# Patient Record
Sex: Female | Born: 1970 | Race: White | Hispanic: No | Smoking: Former smoker
Health system: Southern US, Community
[De-identification: ages and names within clinical notes are randomized; demographics above are authoritative.]

## PROBLEM LIST (undated history)

## (undated) DIAGNOSIS — L709 Acne, unspecified: Secondary | ICD-10-CM

## (undated) DIAGNOSIS — D649 Anemia, unspecified: Secondary | ICD-10-CM

## (undated) DIAGNOSIS — B019 Varicella without complication: Secondary | ICD-10-CM

## (undated) DIAGNOSIS — D229 Melanocytic nevi, unspecified: Secondary | ICD-10-CM

## (undated) DIAGNOSIS — R87619 Unspecified abnormal cytological findings in specimens from cervix uteri: Secondary | ICD-10-CM

## (undated) DIAGNOSIS — Z8619 Personal history of other infectious and parasitic diseases: Secondary | ICD-10-CM

## (undated) DIAGNOSIS — IMO0002 Reserved for concepts with insufficient information to code with codable children: Secondary | ICD-10-CM

## (undated) HISTORY — PX: SKIN BIOPSY: SHX1

## (undated) HISTORY — PX: GYNECOLOGIC CRYOSURGERY: SHX857

## (undated) HISTORY — DX: Anemia, unspecified: D64.9

## (undated) HISTORY — PX: CERVICAL BIOPSY  W/ LOOP ELECTRODE EXCISION: SUR135

## (undated) HISTORY — DX: Unspecified abnormal cytological findings in specimens from cervix uteri: R87.619

## (undated) HISTORY — PX: OTHER SURGICAL HISTORY: SHX169

## (undated) HISTORY — PX: COSMETIC SURGERY: SHX468

## (undated) HISTORY — DX: Reserved for concepts with insufficient information to code with codable children: IMO0002

## (undated) HISTORY — PX: TONSILLECTOMY: SHX5217

## (undated) HISTORY — PX: BREAST SURGERY: SHX581

## (undated) HISTORY — DX: Melanocytic nevi, unspecified: D22.9

## (undated) HISTORY — DX: Personal history of other infectious and parasitic diseases: Z86.19

## (undated) HISTORY — DX: Acne, unspecified: L70.9

## (undated) HISTORY — DX: Varicella without complication: B01.9

---

## 2008-06-26 LAB — HM MAMMOGRAPHY

## 2010-09-25 LAB — HM PAP SMEAR

## 2011-08-08 ENCOUNTER — Encounter: Payer: Self-pay | Admitting: Family Medicine

## 2011-08-08 ENCOUNTER — Ambulatory Visit (INDEPENDENT_AMBULATORY_CARE_PROVIDER_SITE_OTHER): Payer: BC Managed Care – PPO | Admitting: Family Medicine

## 2011-08-08 DIAGNOSIS — L709 Acne, unspecified: Secondary | ICD-10-CM

## 2011-08-08 DIAGNOSIS — Z23 Encounter for immunization: Secondary | ICD-10-CM

## 2011-08-08 DIAGNOSIS — Z309 Encounter for contraceptive management, unspecified: Secondary | ICD-10-CM

## 2011-08-08 DIAGNOSIS — Z0000 Encounter for general adult medical examination without abnormal findings: Secondary | ICD-10-CM

## 2011-08-08 DIAGNOSIS — L708 Other acne: Secondary | ICD-10-CM

## 2011-08-08 DIAGNOSIS — D229 Melanocytic nevi, unspecified: Secondary | ICD-10-CM

## 2011-08-08 DIAGNOSIS — R87619 Unspecified abnormal cytological findings in specimens from cervix uteri: Secondary | ICD-10-CM

## 2011-08-08 DIAGNOSIS — IMO0002 Reserved for concepts with insufficient information to code with codable children: Secondary | ICD-10-CM

## 2011-08-08 DIAGNOSIS — D239 Other benign neoplasm of skin, unspecified: Secondary | ICD-10-CM

## 2011-08-08 HISTORY — DX: Unspecified abnormal cytological findings in specimens from cervix uteri: R87.619

## 2011-08-08 HISTORY — DX: Melanocytic nevi, unspecified: D22.9

## 2011-08-08 HISTORY — DX: Acne, unspecified: L70.9

## 2011-08-08 LAB — RENAL FUNCTION PANEL
Chloride: 108 mEq/L (ref 96–112)
GFR: 101.56 mL/min (ref >60.00)
Glucose, Bld: 84 mg/dL (ref 70–99)
Phosphorus: 2.9 mg/dL (ref 2.3–4.6)
Potassium: 4.2 mEq/L (ref 3.5–5.1)
Sodium: 139 mEq/L (ref 135–145)

## 2011-08-08 LAB — CBC
MCHC: 33.7 g/dL (ref 30.0–36.0)
MCV: 90.8 fl (ref 78.0–100.0)

## 2011-08-08 LAB — LIPID PANEL
HDL: 79.8 mg/dL (ref >39.00)
LDL Cholesterol: 85 mg/dL (ref 0–99)
Total CHOL/HDL Ratio: 2
Triglycerides: 50 mg/dL (ref 0.0–149.0)
VLDL: 10 mg/dL (ref 0.0–40.0)

## 2011-08-08 LAB — TSH: TSH: 1.27 u[IU]/mL (ref 0.35–5.50)

## 2011-08-08 LAB — HEPATIC FUNCTION PANEL
Bilirubin, Direct: 0.1 mg/dL (ref 0.0–0.3)
Total Bilirubin: 0.7 mg/dL (ref 0.3–1.2)

## 2011-08-08 MED ORDER — PARAGARD INTRAUTERINE COPPER IU IUD: INTRAUTERINE_SYSTEM | INTRAUTERINE | Status: DC

## 2011-08-08 NOTE — Progress Notes (Signed)
Patient ID: Susan Mcdaniel, female   DOB: Apr 26, 1971, 41 y.o.   MRN: [id number] QUANIYA DAMAS [id number] [birth year] 08/08/2011      Progress Note New Patient  Subjective  Chief Complaint  Chief Complaint  Patient presents with  . Establish Care    new patient    HPI  This is a 41 year old Caucasian female who is in today for new patient appointment. Overall she is in good health and doing mild. She does have a history of abnormal cervical cytology in the past with several loops and colposcopies last one done during her last pregnancy 6 years ago.  No GYN complaints at this time. She notes a dark lesion she's recently noted on her left arm and multiple irritated skin tags on her neck and under her breasts. Otherwise she's had no recent illness, fever, headache, chest pain or palpitations, shortness of breath, GI or GU complaints. She is interested in going on Retin-A for acne.   Past Medical History  Diagnosis Date  . Chicken pox 25  . Hypertension in pregnancy, preeclampsia/eclampsia/prior HTN/postp cond   . Mole (skin) 08/08/2011  . Abnormal cervical cytology 08/08/2011  . Preventative health care 08/08/2011    Past Surgical History  Procedure Date  . Tonsillectomy 41 yrs old  . Leap   . Skin biopsy     right lower abdominal wall, benign    Family History  Problem Relation Age of Onset  . Hypertension Mother   . Transient ischemic attack Mother   . Cancer Mother     uterine, tah at 72  . Hyperlipidemia Mother   . Cancer Father     lung  . Other Father     coronary artery disease  . Heart disease Father 55    bypass, triple  . Diabetes Father   . Hyperlipidemia Father   . Hypertension Father   . Thyroid disease Father   . Obesity Sister   . Mental illness Sister     bipolar  . Macular degeneration Maternal Grandmother   . Blindness Maternal Grandmother   . Alzheimer's disease Maternal Grandmother   . Cancer Maternal Grandmother     ovarian cancer  . Dementia  Maternal Grandmother     late onset  . Other Maternal Grandfather     stent  . Heart disease Maternal Grandfather     stents, MI, pacer for sinus brady  . Aneurysm Paternal Grandfather     brain    History   Social History  . Marital Status: Married    Spouse Name: N/A    Number of Children: N/A  . Years of Education: N/A   Occupational History  . Not on file.   Social History Main Topics  . Smoking status: Former Smoker -- 1.0 packs/day for 15 years    Types: Cigarettes    Quit date: 06/26/2004  . Smokeless tobacco: Never Used  . Alcohol Use: Yes     occasionally  . Drug Use: No  . Sexually Active: Yes -- Female partner(s)   Other Topics Concern  . Not on file   Social History Narrative  . No narrative on file    No current outpatient prescriptions on file prior to visit.    No Known Allergies  Review of Systems  Review of Systems  Constitutional: Negative for fever and malaise/fatigue.  HENT: Negative for congestion.   Eyes: Negative for discharge.  Respiratory: Negative for shortness of breath.   Cardiovascular: Negative for chest  pain, palpitations and leg swelling.  Gastrointestinal: Negative for nausea, abdominal pain, diarrhea, constipation, blood in stool and melena.  Genitourinary: Negative for dysuria and urgency.  Musculoskeletal: Negative for falls.  Skin: Negative for rash.  Neurological: Negative for loss of consciousness and headaches.  Endo/Heme/Allergies: Negative for polydipsia.  Psychiatric/Behavioral: Negative for depression and suicidal ideas. The patient is not nervous/anxious and does not have insomnia.     Objective  BP 122/78  Pulse 58  Temp(Src) 98.8 F (37.1 C) (Temporal)  Ht 5\' 5"  (1.651 m)  Wt 132 lb 12.8 oz (60.238 kg)  BMI 22.10 kg/m2  SpO2 97%  LMP 07/18/2011  Physical Exam  Physical Exam  Constitutional: She is oriented to person, place, and time and well-developed, well-nourished, and in no distress. No  distress.  HENT:  Head: Normocephalic and atraumatic.  Right Ear: External ear normal.  Left Ear: External ear normal.  Nose: Nose normal.  Mouth/Throat: Oropharynx is clear and moist. No oropharyngeal exudate.  Eyes: Conjunctivae are normal. Pupils are equal, round, and reactive to light. Right eye exhibits no discharge. Left eye exhibits no discharge. No scleral icterus.  Neck: Normal range of motion. Neck supple. No thyromegaly present.  Cardiovascular: Normal rate, regular rhythm, normal heart sounds and intact distal pulses.   No murmur heard. Pulmonary/Chest: Effort normal and breath sounds normal. No respiratory distress. She has no wheezes. She has no rales.  Abdominal: Soft. Bowel sounds are normal. She exhibits no distension and no mass. There is no tenderness.  Musculoskeletal: Normal range of motion. She exhibits no edema and no tenderness.  Lymphadenopathy:    She has no cervical adenopathy.  Neurological: She is alert and oriented to person, place, and time. She has normal reflexes. No cranial nerve deficit. Coordination normal.  Skin: Skin is warm and dry. No rash noted. She is not diaphoretic.       Several large skin tags around base of neck, under left breast. 8 mm brown with dark brown center, circular lesion on right arm.   Psychiatric: Mood, memory and affect normal.       Assessment & Plan  Preventative health care No labs in a couple of years, check fasting labs today, patient agrees to [facility], believes her next pap is due in the spring, will request old records and then have her back for annual pap in 3 months. Patient will proceed with annual paps. Encouraged MegaRed 1 cap daily  Abnormal cervical cytology Will request old records and proceed with annual pap in spring if they remain normal may be able to space out length between paps somewhat.  Mole (skin) Dark lesion on right arm, not black but will have patient seen by dermatology due to heavy sun exposure,  numerous irritated skin tags and mapping  Hypertension in pregnancy, preeclampsia/eclampsia/prior HTN/postp cond Patient without any recent concerns around her bp, not on any meds

## 2011-08-08 NOTE — Assessment & Plan Note (Signed)
Requesting Retin A will refer to dermatology for further consideration

## 2011-08-08 NOTE — Assessment & Plan Note (Signed)
Will request old records and proceed with annual pap in spring if they remain normal may be able to space out length between paps somewhat.

## 2011-08-08 NOTE — Patient Instructions (Signed)

## 2011-08-08 NOTE — Assessment & Plan Note (Signed)
Patient without any recent concerns around her bp, not on any meds

## 2011-08-08 NOTE — Assessment & Plan Note (Signed)
Dark lesion on right arm, not black but will have patient seen by dermatology due to heavy sun exposure, numerous irritated skin tags and mapping

## 2011-08-08 NOTE — Assessment & Plan Note (Signed)
No labs in a couple of years, check fasting labs today, patient agrees to [facility], believes her next pap is due in the spring, will request old records and then have her back for annual pap in 3 months. Patient will proceed with annual paps. Encouraged MegaRed 1 cap daily

## 2011-11-06 ENCOUNTER — Other Ambulatory Visit (HOSPITAL_COMMUNITY)
Admission: RE | Admit: 2011-11-06 | Discharge: 2011-11-06 | Disposition: A | Discharge status: Home / Self Care | Payer: BC Managed Care – PPO | Source: Ambulatory Visit | Attending: Family Medicine | Admitting: Family Medicine

## 2011-11-06 ENCOUNTER — Ambulatory Visit (INDEPENDENT_AMBULATORY_CARE_PROVIDER_SITE_OTHER): Payer: BC Managed Care – PPO | Admitting: Family Medicine

## 2011-11-06 ENCOUNTER — Encounter: Payer: Self-pay | Admitting: Family Medicine

## 2011-11-06 VITALS — BP 102/69 | HR 70 | Temp 99.5°F | Ht 65.0 in | Wt 132.4 lb

## 2011-11-06 DIAGNOSIS — Z0000 Encounter for general adult medical examination without abnormal findings: Secondary | ICD-10-CM

## 2011-11-06 DIAGNOSIS — D649 Anemia, unspecified: Secondary | ICD-10-CM

## 2011-11-06 DIAGNOSIS — Z01419 Encounter for gynecological examination (general) (routine) without abnormal findings: Secondary | ICD-10-CM | POA: Insufficient documentation

## 2011-11-06 DIAGNOSIS — IMO0002 Reserved for concepts with insufficient information to code with codable children: Secondary | ICD-10-CM

## 2011-11-06 DIAGNOSIS — D709 Neutropenia, unspecified: Secondary | ICD-10-CM

## 2011-11-06 DIAGNOSIS — Z124 Encounter for screening for malignant neoplasm of cervix: Secondary | ICD-10-CM

## 2011-11-06 DIAGNOSIS — R87619 Unspecified abnormal cytological findings in specimens from cervix uteri: Secondary | ICD-10-CM

## 2011-11-06 HISTORY — DX: Anemia, unspecified: D64.9

## 2011-11-06 NOTE — Assessment & Plan Note (Signed)
BP good today

## 2011-11-06 NOTE — Assessment & Plan Note (Signed)
Mild encouraged increased iron intake in diet

## 2011-11-06 NOTE — Assessment & Plan Note (Signed)
Mild, will recheck CBC in 2-3 months

## 2011-11-06 NOTE — Progress Notes (Signed)
Patient ID: Susan Mcdaniel, female   DOB: [birth year], 41 y.o.   MRN: [id number] Susan Mcdaniel [id number] Apr 05, 1971 11/06/2011      Progress Note-Follow Up  Subjective  Chief Complaint  Chief Complaint  Patient presents with  . Gynecologic Exam    pap and breast exam    HPI  Patient is a 41 year old Caucasian female in today for GYN exam she reports she feels well. No recent illness, fevers, chills, chest pain, palpitations, shortness of breath, GI or GU complaints. No changes since first visit and to see dermatology since last visit. Lesion on right arm and under right breast had atypia and she is going back for further excision. She is noting she feels she's had some low-grade temperatures roughly 99.5 off and on for a couple of months now but no other symptoms to suggest illness. At the beginning she said she did feel she had a viral URI. No GYN complaints. No discharge lesions concerns. Has not received her mammogram yet.  Past Medical History  Diagnosis Date  . Chicken pox 25  . Hypertension in pregnancy, preeclampsia/eclampsia/prior HTN/postp cond   . Mole (skin) 08/08/2011  . Abnormal cervical cytology 08/08/2011  . Preventative health care 08/08/2011  . Acne 08/08/2011  . Neutropenia 11/06/2011  . Anemia 11/06/2011    Past Surgical History  Procedure Date  . Tonsillectomy 41 yrs old  . Leap   . Skin biopsy     right lower abdominal wall, benign    Family History  Problem Relation Age of Onset  . Hypertension Mother   . Transient ischemic attack Mother   . Cancer Mother     uterine, tah at 17  . Hyperlipidemia Mother   . Cancer Father     lung  . Other Father     coronary artery disease  . Heart disease Father 55    bypass, triple  . Diabetes Father   . Hyperlipidemia Father   . Hypertension Father   . Thyroid disease Father   . Obesity Sister   . Mental illness Sister     bipolar  . Macular degeneration Maternal Grandmother   . Blindness Maternal Grandmother     . Alzheimer's disease Maternal Grandmother   . Cancer Maternal Grandmother     ovarian cancer  . Dementia Maternal Grandmother     late onset  . Other Maternal Grandfather     stent  . Heart disease Maternal Grandfather     stents, MI, pacer for sinus brady  . Aneurysm Paternal Grandfather     brain    History   Social History  . Marital Status: Married    Spouse Name: N/A    Number of Children: N/A  . Years of Education: N/A   Occupational History  . Not on file.   Social History Main Topics  . Smoking status: Former Smoker -- 1.0 packs/day for 15 years    Types: Cigarettes    Quit date: 06/26/2004  . Smokeless tobacco: Never Used  . Alcohol Use: Yes     occasionally  . Drug Use: No  . Sexually Active: Yes -- Female partner(s)   Other Topics Concern  . Not on file   Social History Narrative  . No narrative on file    Current Outpatient Prescriptions on File Prior to Visit  Medication Sig Dispense Refill  . ibuprofen (ADVIL,MOTRIN) 600 MG tablet Take 600 mg by mouth every 6 (six) hours as needed.      Marland Kitchen  IUD's (PARAGARD INTRAUTERINE COPPER) IUD IUD In place since birth of 61 year old son  1 each  0    No Known Allergies  Review of Systems  Review of Systems  Constitutional: Positive for fever. Negative for malaise/fatigue.       Questionable low grade fevers with max of 99.5 for past several months  HENT: Negative for congestion.   Eyes: Negative for discharge.  Respiratory: Negative for shortness of breath.   Cardiovascular: Negative for chest pain, palpitations and leg swelling.  Gastrointestinal: Negative for nausea, abdominal pain and diarrhea.  Genitourinary: Negative for dysuria.  Musculoskeletal: Negative for falls.  Skin: Negative for rash.  Neurological: Negative for loss of consciousness and headaches.  Endo/Heme/Allergies: Negative for polydipsia.  Psychiatric/Behavioral: Negative for depression and suicidal ideas. The patient is not  nervous/anxious and does not have insomnia.     Objective  BP 102/69  Pulse 70  Temp(Src) 99.5 F (37.5 C) (Temporal)  Ht 5\' 5"  (1.651 m)  Wt 132 lb 6.4 oz (60.056 kg)  BMI 22.03 kg/m2  SpO2 100%  LMP 10/30/2011  Physical Exam  Physical Exam  Constitutional: She is oriented to person, place, and time and well-developed, well-nourished, and in no distress. No distress.  HENT:  Head: Normocephalic and atraumatic.  Eyes: Conjunctivae are normal.  Neck: Neck supple. No thyromegaly present.  Cardiovascular: Normal rate, regular rhythm and normal heart sounds.   No murmur heard. Pulmonary/Chest: Effort normal and breath sounds normal. She has no wheezes.  Abdominal: She exhibits no distension and no mass.  Genitourinary: Vagina normal, uterus normal, cervix normal, right adnexa normal and left adnexa normal. No vaginal discharge found.       No string seen in os  Musculoskeletal: She exhibits no edema.  Lymphadenopathy:    She has no cervical adenopathy.  Neurological: She is alert and oriented to person, place, and time.  Skin: Skin is warm and dry. No rash noted. She is not diaphoretic.  Psychiatric: Memory, affect and judgment normal.    Lab Results  Component Value Date   TSH 1.27 08/08/2011   Lab Results  Component Value Date   WBC 4.1* 08/08/2011   HGB 12.1 08/08/2011   HCT 35.9* 08/08/2011   MCV 90.8 08/08/2011   PLT 196.0 08/08/2011   Lab Results  Component Value Date   CREATININE 0.7 08/08/2011   BUN 14 08/08/2011   NA 139 08/08/2011   K 4.2 08/08/2011   CL 108 08/08/2011   CO2 23 08/08/2011   Lab Results  Component Value Date   ALT 14 08/08/2011   AST 19 08/08/2011   ALKPHOS 38* 08/08/2011   BILITOT 0.7 08/08/2011   Lab Results  Component Value Date   CHOL 175 08/08/2011   Lab Results  Component Value Date   HDL 79.80 08/08/2011   Lab Results  Component Value Date   LDLCALC 85 08/08/2011   Lab Results  Component Value Date   TRIG 50.0 08/08/2011   Lab  Results  Component Value Date   CHOLHDL 2 08/08/2011     Assessment & Plan  Abnormal cervical cytology Cervical cancer screen, no concerns identified but was unable to see IUD string today. Patient reports the string was cut very short. We may consider a pelvic Ultrasound to check IUD placement  Hypertension in pregnancy, preeclampsia/eclampsia/prior HTN/postp cond BP good today  Neutropenia Mild, will recheck CBC in 2-3 months  Anemia Mild encouraged increased iron intake in diet  Preventative health care  Annual labs reviewed with patient today. Return in 1 year or as needed  Mole (skin) Has had several lesions removed by dermatology one on right arm and under right breast showed some atypia so she is scheduled to return for wider excision and further eval

## 2011-11-06 NOTE — Assessment & Plan Note (Signed)
Cervical cancer screen, no concerns identified but was unable to see IUD string today. Patient reports the string was cut very short. We may consider a pelvic Ultrasound to check IUD placement

## 2011-11-06 NOTE — Assessment & Plan Note (Signed)
Has had several lesions removed by dermatology one on right arm and under right breast showed some atypia so she is scheduled to return for wider excision and further eval

## 2011-11-06 NOTE — Assessment & Plan Note (Signed)
Annual labs reviewed with patient today. Return in 1 year or as needed

## 2011-11-06 NOTE — Patient Instructions (Signed)
Mammogram A mammogram is an X-ray test to find changes in a woman's breast. You should get a mammogram if:  You are over 41 years of age.   You have risk factors.   Your doctor recommends that you have one.  BEFORE THE TEST  Do not schedule the test the week before your period, especially if your breasts are sore during this time.  On the day of your mammogram:  Wash your breasts and armpits well. After washing, do not put on any deodorant or talcum powder on until after your test.   Eat and drink as you usually do.   Take your medicines as usual.   If you are diabetic and take insulin, make sure you:   Eat before coming for your test.   Take your insulin as usual.   If you cannot keep your appointment, call before the appointment to cancel. Schedule another appointment.  TEST  You will need to undress from the waist up. You will put on a hospital gown.   Your breast will be put on the mammogram machine, and it will press firmly on your breast with a piece of plastic called a compression paddle. This will make your breast flatter so that the machine can X-ray all parts of your breast.   Both breasts will be X-rayed. Each breast will be X-rayed from above and from the side. An X-ray might need to be taken again if the picture is not good enough.   The mammogram will last about 15 to 30 minutes.  AFTER THE TEST Finding out the results of your test Ask when your test results will be ready. Make sure you get your test results. Document Released: 09/08/2008 Document Revised: 06/01/2011 Document Reviewed: 09/08/2008 Exit[employer] Patient Information 2012 [employer]. 

## 2011-11-23 ENCOUNTER — Other Ambulatory Visit: Payer: Self-pay | Admitting: Dermatology

## 2012-01-29 ENCOUNTER — Other Ambulatory Visit: Payer: BC Managed Care – PPO

## 2012-01-30 ENCOUNTER — Telehealth: Payer: Self-pay | Admitting: Family Medicine

## 2012-01-30 NOTE — Telephone Encounter (Signed)
Pt is going to fax [place] a copy of her immunization records

## 2012-01-30 NOTE — Telephone Encounter (Signed)
Left a message for pt to return my call. 

## 2012-01-30 NOTE — Telephone Encounter (Signed)
Patient needs to give proof of her last MMR with [place] and the one before which was in [place] by Dr Earley Abide office in 1973, patient has a hard copy of the proof of immunization if you need it. Patient states her insurance company is requiring these to be on the same piece of paper. Please contact patient

## 2012-05-14 ENCOUNTER — Other Ambulatory Visit: Payer: Self-pay | Admitting: Family Medicine

## 2012-05-14 DIAGNOSIS — Z139 Encounter for screening, unspecified: Secondary | ICD-10-CM

## 2012-06-13 ENCOUNTER — Ambulatory Visit (INDEPENDENT_AMBULATORY_CARE_PROVIDER_SITE_OTHER): Payer: BC Managed Care – PPO

## 2012-06-13 DIAGNOSIS — Z1231 Encounter for screening mammogram for malignant neoplasm of breast: Secondary | ICD-10-CM

## 2012-06-13 DIAGNOSIS — Z139 Encounter for screening, unspecified: Secondary | ICD-10-CM

## 2012-07-12 ENCOUNTER — Telehealth: Payer: Self-pay | Admitting: Family Medicine

## 2012-07-12 NOTE — Telephone Encounter (Signed)
Pt informed that they should of contacted her per MD but pt needs to contact them to get additional views of left breast. Pt voiced understanding

## 2012-07-17 ENCOUNTER — Other Ambulatory Visit: Payer: Self-pay | Admitting: Family Medicine

## 2012-07-17 DIAGNOSIS — R928 Other abnormal and inconclusive findings on diagnostic imaging of breast: Secondary | ICD-10-CM

## 2012-08-05 ENCOUNTER — Ambulatory Visit
Admission: RE | Admit: 2012-08-05 | Discharge: 2012-08-05 | Disposition: A | Discharge status: Home / Self Care | Payer: BC Managed Care – PPO | Source: Ambulatory Visit | Attending: Family Medicine | Admitting: Family Medicine

## 2012-08-05 DIAGNOSIS — R928 Other abnormal and inconclusive findings on diagnostic imaging of breast: Secondary | ICD-10-CM

## 2012-11-04 ENCOUNTER — Other Ambulatory Visit (INDEPENDENT_AMBULATORY_CARE_PROVIDER_SITE_OTHER): Payer: BC Managed Care – PPO

## 2012-11-04 DIAGNOSIS — Z0000 Encounter for general adult medical examination without abnormal findings: Secondary | ICD-10-CM

## 2012-11-04 DIAGNOSIS — D709 Neutropenia, unspecified: Secondary | ICD-10-CM

## 2012-11-04 LAB — RENAL FUNCTION PANEL
Albumin: 3.9 g/dL (ref 3.5–5.2)
BUN: 12 mg/dL (ref 6–23)
CO2: 23 mEq/L (ref 19–32)
Calcium: 8.6 mg/dL (ref 8.4–10.5)
Creatinine, Ser: 0.7 mg/dL (ref 0.4–1.2)
Glucose, Bld: 78 mg/dL (ref 70–99)
Sodium: 138 mEq/L (ref 135–145)

## 2012-11-04 LAB — CBC WITH DIFFERENTIAL/PLATELET
Basophils Relative: 0.8 % (ref 0.0–3.0)
Eosinophils Relative: 1.5 % (ref 0.0–5.0)
Lymphocytes Relative: 32.6 % (ref 12.0–46.0)
Monocytes Absolute: 0.2 10*3/uL (ref 0.1–1.0)
Neutrophils Relative %: 60.4 % (ref 43.0–77.0)
Platelets: 203 10*3/uL (ref 150.0–400.0)
RBC: 3.65 Mil/uL — ABNORMAL LOW (ref 3.87–5.11)
WBC: 3.5 10*3/uL — ABNORMAL LOW (ref 4.5–10.5)

## 2012-11-04 LAB — HEPATIC FUNCTION PANEL
ALT: 12 U/L (ref 0–35)
AST: 16 U/L (ref 0–37)
Alkaline Phosphatase: 33 U/L — ABNORMAL LOW (ref 39–117)
Total Bilirubin: 0.5 mg/dL (ref 0.3–1.2)

## 2012-11-04 LAB — LIPID PANEL
LDL Cholesterol: 72 mg/dL (ref 0–99)
Total CHOL/HDL Ratio: 2
VLDL: 10 mg/dL (ref 0.0–40.0)

## 2012-11-04 NOTE — Progress Notes (Signed)
Labs only

## 2012-11-05 LAB — TSH: TSH: 1.1 u[IU]/mL (ref 0.35–5.50)

## 2012-11-11 ENCOUNTER — Encounter: Payer: BC Managed Care – PPO | Admitting: Family Medicine

## 2012-11-13 ENCOUNTER — Encounter: Payer: Self-pay | Admitting: Family Medicine

## 2012-11-13 ENCOUNTER — Other Ambulatory Visit (HOSPITAL_COMMUNITY)
Admission: RE | Admit: 2012-11-13 | Discharge: 2012-11-13 | Disposition: A | Discharge status: Home / Self Care | Payer: BC Managed Care – PPO | Source: Ambulatory Visit | Attending: Family Medicine | Admitting: Family Medicine

## 2012-11-13 ENCOUNTER — Ambulatory Visit
Admission: RE | Admit: 2012-11-13 | Discharge: 2012-11-13 | Disposition: A | Discharge status: Home / Self Care | Payer: BC Managed Care – PPO | Source: Ambulatory Visit | Attending: Nurse Practitioner | Admitting: Nurse Practitioner

## 2012-11-13 ENCOUNTER — Ambulatory Visit (INDEPENDENT_AMBULATORY_CARE_PROVIDER_SITE_OTHER): Payer: BC Managed Care – PPO | Admitting: Nurse Practitioner

## 2012-11-13 ENCOUNTER — Encounter: Payer: Self-pay | Admitting: Nurse Practitioner

## 2012-11-13 ENCOUNTER — Encounter: Payer: BC Managed Care – PPO | Admitting: Family Medicine

## 2012-11-13 VITALS — BP 90/54 | HR 77 | Temp 98.1°F | Ht 65.0 in | Wt 130.2 lb

## 2012-11-13 DIAGNOSIS — E559 Vitamin D deficiency, unspecified: Secondary | ICD-10-CM

## 2012-11-13 DIAGNOSIS — Z8049 Family history of malignant neoplasm of other genital organs: Secondary | ICD-10-CM

## 2012-11-13 DIAGNOSIS — D709 Neutropenia, unspecified: Secondary | ICD-10-CM

## 2012-11-13 DIAGNOSIS — Z124 Encounter for screening for malignant neoplasm of cervix: Secondary | ICD-10-CM

## 2012-11-13 DIAGNOSIS — R0781 Pleurodynia: Secondary | ICD-10-CM

## 2012-11-13 DIAGNOSIS — H04129 Dry eye syndrome of unspecified lacrimal gland: Secondary | ICD-10-CM

## 2012-11-13 DIAGNOSIS — R21 Rash and other nonspecific skin eruption: Secondary | ICD-10-CM

## 2012-11-13 DIAGNOSIS — Z01419 Encounter for gynecological examination (general) (routine) without abnormal findings: Secondary | ICD-10-CM | POA: Insufficient documentation

## 2012-11-13 DIAGNOSIS — R079 Chest pain, unspecified: Secondary | ICD-10-CM

## 2012-11-13 DIAGNOSIS — Z8041 Family history of malignant neoplasm of ovary: Secondary | ICD-10-CM

## 2012-11-13 DIAGNOSIS — H04123 Dry eye syndrome of bilateral lacrimal glands: Secondary | ICD-10-CM

## 2012-11-13 LAB — URINALYSIS, ROUTINE W REFLEX MICROSCOPIC
Bilirubin Urine: NEGATIVE
Nitrite: NEGATIVE
Total Protein, Urine: NEGATIVE
Urine Glucose: NEGATIVE
Urobilinogen, UA: 0.2 (ref 0.0–1.0)

## 2012-11-13 NOTE — Patient Instructions (Signed)
Given your history of back & rib pain, rash on hands, raynaud's symptoms, and neutropenia I have ordered some labs to look for potential causes like autoimmune disease. I will call you with you with results. I will also call you regarding PaP and T-spine result.  Take EC 81 mg aspirin daily along with 1 tab Mega red krill oil daily. I will advise you further regarding Vit dose once lab back. Pleasure to meet you, you look great!

## 2012-11-14 LAB — HLA-B27 ANTIGEN: DNA Result:: NOT DETECTED

## 2012-11-14 LAB — ANA: Anti Nuclear Antibody(ANA): NEGATIVE

## 2012-11-14 LAB — LUPUS ANTICOAGULANT PANEL: Lupus Anticoagulant: NOT DETECTED

## 2012-11-14 LAB — VITAMIN D 25 HYDROXY (VIT D DEFICIENCY, FRACTURES): Vit D, 25-Hydroxy: 44 ng/mL (ref 30–89)

## 2012-11-14 NOTE — Progress Notes (Signed)
Subjective:     Susan Mcdaniel is a 42 y.o. female and is here for a comprehensive physical exam. The patient reports problems - occasional dry eyes needing drops; occasional thoracic back pain; cold hands & feet; dime sized rash on hands 2 weeks ago.  History   Social History  . Marital Status: Married    Spouse Name: N/A    Number of Children: N/A  . Years of Education: N/A   Occupational History  . Not on file.   Social History Main Topics  . Smoking status: Former Smoker -- 1.00 packs/day for 15 years    Types: Cigarettes    Quit date: 06/26/2004  . Smokeless tobacco: Never Used  . Alcohol Use: Yes     Comment: occasionally  . Drug Use: No  . Sexually Active: Yes -- Female partner(s)   Other Topics Concern  . Not on file   Social History Narrative  . No narrative on file   Health Maintenance  Topic Date Due  . Influenza Vaccine  02/24/2013  . Pap Smear  11/14/2015  . Tetanus/tdap  08/07/2021    The following portions of the patient's history were reviewed and updated as appropriate: allergies, current medications, past family history, past medical history, past social history, past surgical history and problem list.  Review of Systems A comprehensive review of systems was negative except for: Eyes: positive for redness and dry eyes, needing drops to soothe; family Hx of uterine and ovarian cancer Genitourinary: positive for spotting between periods. Occasional brown mucoid d/c on toilet paper. and . Integument/breast: positive for skin lesion(s) Musculoskeletal: positive for back pain and R-sided thoracic pain that radiates around ribs to anterior chest .   Objective:    BP 90/54  Pulse 77  Temp(Src) 98.1 F (36.7 C) (Oral)  Ht 5\' 5"  (1.651 m)  Wt 130 lb 4 oz (59.081 kg)  BMI 21.67 kg/m2  SpO2 99%  LMP 10/30/2012 General appearance: alert, cooperative, appears stated age and no distress Head: Normocephalic, without obvious abnormality, atraumatic Eyes:  conjunctivae/corneas clear. PERRL, EOM's intact. Fundi benign. Ears: normal TM's and external ear canals both ears Nose: Nares normal. Septum midline. Mucosa normal. No drainage or sinus tenderness. Throat: lips, mucosa, and tongue normal; teeth and gums normal Neck: no adenopathy, no carotid bruit, no JVD, supple, symmetrical, trachea midline and thyroid not enlarged, symmetric, no tenderness/mass/nodules Back: symmetric, no curvature. ROM normal. No CVA tenderness. Lungs: clear to auscultation bilaterally Heart: regular rate and rhythm, S1, S2 normal, no murmur, click, rub or gallop Abdomen: soft, non-tender; bowel sounds normal; no masses,  no organomegaly Pelvic: adnexae not palpable, external genitalia normal, no adnexal masses or tenderness, no cervical motion tenderness, perianal skin: no external genital warts noted, rectovaginal septum normal, urethra without abnormality or discharge, uterus normal size, shape, and consistency, vagina normal without discharge and scant brown streaks in thick mucous covering cervix. Not able to visualize IUD string Extremities: extremities normal, atraumatic, no cyanosis or edema Pulses: 2+ and symmetric Skin: Skin color, texture, turgor normal. No rashes or lesions or cool hands & feet. bluish nail beds hands Lymph nodes: Cervical, supraclavicular, and axillary nodes normal.    Assessment:   1. fam Hx uterine & ovarian cancer 2. Brown streaks in cervical mucous 3. Dry eyes, raynaud's symptoms, recent macular rash, Hx costochondritis & progressively worsening thoracic back pain, Neutropenia, anemia  Plan:  1. PAP yearly w/hemoccult screen for colon ca 2. May be r/t irritation from IUD, will  monitor results of PAP 3. Will screen for autoimmune disease, refer as needed. May need to consider hematology referral due to low WBC   See After Visit Summary for Counseling Recommendations

## 2012-11-15 ENCOUNTER — Telehealth: Payer: Self-pay | Admitting: Nurse Practitioner

## 2012-11-15 LAB — PROTEIN ELECTROPHORESIS, SERUM, WITH REFLEX
Albumin ELP: 62.6 % (ref 55.8–66.1)
Alpha-2-Globulin: 9.5 % (ref 7.1–11.8)
Beta 2: 4.9 % (ref 3.2–6.5)
Beta Globulin: 7 % (ref 4.7–7.2)
Total Protein, Serum Electrophoresis: 6.8 g/dL (ref 6.0–8.3)

## 2012-11-15 NOTE — Telephone Encounter (Signed)
See telephone note.

## 2012-11-19 ENCOUNTER — Encounter: Payer: Self-pay | Admitting: Nurse Practitioner

## 2012-11-19 NOTE — Telephone Encounter (Signed)
Please advise 

## 2012-11-20 ENCOUNTER — Telehealth: Payer: Self-pay | Admitting: Nurse Practitioner

## 2012-11-20 DIAGNOSIS — L679 Hair color and hair shaft abnormality, unspecified: Secondary | ICD-10-CM

## 2012-11-20 MED ORDER — BIMATOPROST 0.03 % EX SOLN: 1.0000 [drp] | Freq: Every day | CUTANEOUS | Status: DC

## 2012-11-20 NOTE — Telephone Encounter (Signed)
See telephone note.

## 2012-11-21 ENCOUNTER — Telehealth: Payer: Self-pay | Admitting: *Deleted

## 2012-11-21 DIAGNOSIS — M869 Osteomyelitis, unspecified: Secondary | ICD-10-CM

## 2012-11-21 NOTE — Telephone Encounter (Signed)
Pt called to inform that [facility] [Dr. Beekman/Dr. Hawkes] office would not "even speak with her RE: an appointment until they receive referral & records", provider informed. Referral in system per Maximino Sarin, NP & I have faxed the office visit notes w/labs to [facility]

## 2012-11-21 NOTE — Telephone Encounter (Signed)
Patient left message concerning referral to Rheumatologist. Patient stated that she wants Layne to recommend which rheumatologist prefers. Called patient back to let her know Layne recommends Dev[facility]war from [facility] or Mellen or [facility] from [facility]. Advised patient to call back and let Layne know which rheumatologist she chooses.

## 2012-11-21 NOTE — Addendum Note (Signed)
Addended by: Alben Spittle, Konrad Felix [facility] on: 11/21/2012 04:35 PM   Modules accepted: Orders

## 2012-11-25 ENCOUNTER — Encounter: Payer: Self-pay | Admitting: Nurse Practitioner

## 2012-12-03 ENCOUNTER — Encounter: Payer: Self-pay | Admitting: Nurse Practitioner

## 2012-12-03 NOTE — Plan of Care (Signed)
Pt called front desk, advised she wants to change rheumatology referral to [facility]. New Consultant letter sent.

## 2013-02-17 ENCOUNTER — Telehealth: Payer: Self-pay | Admitting: Hematology & Oncology

## 2013-02-17 NOTE — Telephone Encounter (Signed)
Called pt to schedule appointment she said she would call me back tomorrow.

## 2013-02-18 ENCOUNTER — Telehealth: Payer: Self-pay | Admitting: Hematology & Oncology

## 2013-02-18 NOTE — Telephone Encounter (Signed)
Pt aware of 9-12 appointment °

## 2013-02-20 ENCOUNTER — Encounter: Payer: Self-pay | Admitting: Hematology & Oncology

## 2013-02-20 ENCOUNTER — Telehealth: Payer: Self-pay | Admitting: Hematology & Oncology

## 2013-02-20 NOTE — Telephone Encounter (Signed)
A Female called from referring wanting to know if we had scheduled appointment. He did not leave his name. I called back and Donnie is aware pt is schedule

## 2013-03-07 ENCOUNTER — Other Ambulatory Visit (HOSPITAL_BASED_OUTPATIENT_CLINIC_OR_DEPARTMENT_OTHER): Payer: BC Managed Care – PPO | Admitting: Lab

## 2013-03-07 ENCOUNTER — Ambulatory Visit (HOSPITAL_BASED_OUTPATIENT_CLINIC_OR_DEPARTMENT_OTHER): Payer: BC Managed Care – PPO | Admitting: Hematology & Oncology

## 2013-03-07 ENCOUNTER — Ambulatory Visit: Payer: BC Managed Care – PPO

## 2013-03-07 VITALS — BP 98/69 | HR 90 | Temp 98.1°F | Resp 20 | Ht 64.0 in | Wt 127.0 lb

## 2013-03-07 DIAGNOSIS — D709 Neutropenia, unspecified: Secondary | ICD-10-CM

## 2013-03-07 LAB — CBC WITH DIFFERENTIAL (CANCER CENTER ONLY)
BASO#: 0 10*3/uL (ref 0.0–0.2)
BASO%: 0.4 % (ref 0.0–2.0)
EOS%: 0.8 % (ref 0.0–7.0)
LYMPH#: 1.3 10*3/uL (ref 0.9–3.3)
MCH: 30.5 pg (ref 26.0–34.0)
MCHC: 33.5 g/dL (ref 32.0–36.0)
MONO%: 6.5 % (ref 0.0–13.0)
NEUT#: 3.5 10*3/uL (ref 1.5–6.5)
Platelets: 199 10*3/uL (ref 145–400)
RDW: 12.9 % (ref 11.1–15.7)

## 2013-03-07 LAB — CHCC SATELLITE - SMEAR

## 2013-03-07 LAB — FERRITIN CHCC: Ferritin: 17 ng/ml (ref 9–269)

## 2013-03-07 LAB — IRON AND TIBC CHCC
%SAT: 17 % — ABNORMAL LOW (ref 21–57)
Iron: 61 ug/dL (ref 41–142)

## 2013-03-07 NOTE — Progress Notes (Signed)
This office note has been dictated.

## 2013-03-10 LAB — TRANSFERRIN RECEPTOR, SOLUABLE: Transferrin Receptor, Soluble: 1.18 mg/L (ref 0.76–1.76)

## 2013-03-10 LAB — RETICULOCYTES (CHCC): ABS Retic: 19.4 10*3/uL (ref 19.0–186.0)

## 2013-03-10 NOTE — Progress Notes (Signed)
CC:   Maximino Sarin, FNP Kathryne Hitch, MD  DIAGNOSIS:  Transient leukopenia and anemia.  HISTORY OF PRESENT ILLNESS:  [name] is a very charming 42 year old white female.  She works for [employer].  She does defibrillator and pacemaker implants.  She knows the cardiologists over at [facility] very, very well.  She, her husband and her family moved up to [place] about two years ago.  She has been doing well.  She really has had no kind of health issues.  She was having some back discomfort.  She had x-rays done.  This was earlier this year.  X-rays did not show any obvious abnormality as far as I can tell.  I looked at the disks that she brought and did not see anything that was that unusual.  She has been having some costochondritis issues.  She sees Dr. Alben Spittle. Dr. Alben Spittle sent Ms. Brust to Dr. Corliss Skains, this was back in August. Blood work that was done showed a white cell count of 2.8, hemoglobin 11.3, hematocrit 33.7 and platelet count was 216.  MCV was 89.  She had normal antibody titers that were drawn.  Back in on February of 2013, her white count was 4.1.  In May of 2014, white cell count was 3.5 with a hemoglobin 11.5, hematocrit 33.2.  Because of this leukopenia, she was kindly referred to the [facility] to be evaluated.  She feels well.  She states she has had no problem with infections.  She has had no nausea or vomiting.  She is not a vegetarian.  She has had no change in bowel or bladder habits.  She has "self-diagnosed" rosacea.  She is a Dietitian" with medications.  She has a IUD in, but this has been in for 8 years she says.  She has had no headaches.  There has been no dysphagia or odynophagia. She has had no mouth sores or sore tongue.  She is active physically.  She does yoga.  She has had no double vision or blurred vision.  She has not noticed any palpable lymph glands.  Again, she is kindly referred to the [facility] for an evaluation.  PAST MEDICAL HISTORY:  Her past medical history is pretty much unremarkable.  ALLERGIES:  None.  MEDICATIONS:  Latisse drops for her eyelashes, IUD-nonhormonal, Motrin as indicated.  SOCIAL HISTORY:  Negative for tobacco use.  She has rare alcohol use. She actually stopped smoking back in 2006.  She has no obvious occupational exposures.  Again, she is very active physically.  FAMILY HISTORY:  Remarkable for her father died of lung cancer.  There is a history of hypertension, TIAs, and diabetes.  She has two kids. They are healthy.  REVIEW OF SYSTEMS:  Review of systems is as stated in history of present illness.  No additional findings are noted on a 12-system review.  PHYSICAL EXAMINATION:  General:  This is a well developed, well nourished white female in no obvious distress.  Vital signs: Temperature 98.1, pulse 90, respiratory rate 20, blood pressure 98/69, weight is 127.  Head and Neck:  Shows a normocephalic, atraumatic skull. There are no ocular or oral lesions.  There are no palpable cervical or supraclavicular lymph nodes.  Thyroid is nonpalpable.  Lungs:  Clear to percussion and auscultation bilaterally.  There are no rales, wheezes or rhonchi.  Cardiac:  Regular rate and rhythm with a normal S1 and S2. She has no murmurs, rubs or bruits.  Abdomen:  Soft.  She has good bowel sounds.  There is no palpable abdominal mass.  There is no fluid wave. There is no palpable hepatosplenomegaly.  No tenderness over the spine, ribs or hips.  Extremities:  Show no clubbing, cyanosis or edema.  She has good range motion of her joints.  There is good muscle strength in upper and lower extremities.  Skin:  No rashes, ecchymosis or petechia. Neurological:  No focal neurological deficit.  LABORATORY STUDIES:  White cell count 5.1, hemoglobin 11.7, hematocrit 34.9, platelet count 199.  MCV is 91.  White cell differential shows 68 segs, 24  lymphocytes, 6 monos, 1 eosinophil.  Peripheral smear, I reviewed this personally.  This showed a normochromic, normocytic population of red blood cells.  There are no nucleated red blood cells.  I see no teardrop cells.  There are no target cells.  I see no rouleaux formation.  White cells appear normal in morphology and maturation.  There is no immature myeloid or lymphoid forms.  There are no hypersegmented polys.  There are no atypical lymphocytes.  There are no blasts.  Platelets are adequate in number and size.  IMPRESSION:  [name] is a very charming 42 year old white female with transient leukopenia.  I really cannot explain why she had this.  I told her this might be her "normal."  I just do not see anything on the blood smear right now that would suggest an underlying hematologic disorder.  Again, she may have a rheumatologic issue and possibly some transient leukopenia from this.  I do not see any kind of autoimmune leukopenia or any kind of collagen vascular disease.  I do not believe that she needs a bone marrow test.  I do not believe she needs any x-ray tests.  I sent a good hour with [name].  I reviewed the labs with her.  She is medically oriented, so she certainly understood what was going on.  We do not need to see Ms. Brust again.  I just do not see that we have any kind of hematologic issue.  Again, [name] has had transient leukopenia of unclear etiology.  Her blood counts today looked fantastic.  I cannot find anything on her physical exam nor see anything on her blood smear.    ______________________________ Josph Macho, M.D. PRE/MEDQ  D:  03/07/2013  T:  03/08/2013  Job:  [id number]

## 2013-04-21 ENCOUNTER — Other Ambulatory Visit: Payer: Self-pay | Admitting: *Deleted

## 2013-04-21 DIAGNOSIS — L679 Hair color and hair shaft abnormality, unspecified: Secondary | ICD-10-CM

## 2013-04-21 MED ORDER — BIMATOPROST 0.03 % EX SOLN: 1.0000 [drp] | Freq: Every day | CUTANEOUS | Status: DC

## 2013-04-21 NOTE — Telephone Encounter (Signed)
error 

## 2013-05-01 ENCOUNTER — Other Ambulatory Visit: Payer: Self-pay

## 2013-08-15 ENCOUNTER — Encounter: Payer: Self-pay | Admitting: Gynecology

## 2013-08-21 ENCOUNTER — Encounter: Payer: Self-pay | Admitting: Obstetrics and Gynecology

## 2013-09-10 ENCOUNTER — Encounter: Payer: Self-pay | Admitting: Obstetrics and Gynecology

## 2013-09-10 ENCOUNTER — Ambulatory Visit (INDEPENDENT_AMBULATORY_CARE_PROVIDER_SITE_OTHER): Payer: BC Managed Care – PPO | Admitting: Obstetrics and Gynecology

## 2013-09-10 VITALS — BP 100/60 | HR 72 | Resp 14 | Ht 65.0 in | Wt 114.0 lb

## 2013-09-10 DIAGNOSIS — D709 Neutropenia, unspecified: Secondary | ICD-10-CM

## 2013-09-10 DIAGNOSIS — D509 Iron deficiency anemia, unspecified: Secondary | ICD-10-CM

## 2013-09-10 DIAGNOSIS — T8332XA Displacement of intrauterine contraceptive device, initial encounter: Secondary | ICD-10-CM

## 2013-09-10 DIAGNOSIS — T8339XA Other mechanical complication of intrauterine contraceptive device, initial encounter: Secondary | ICD-10-CM

## 2013-09-10 LAB — CBC WITH DIFFERENTIAL/PLATELET
Basophils Absolute: 0 10*3/uL (ref 0.0–0.1)
Basophils Relative: 1 % (ref 0–1)
EOS ABS: 0 10*3/uL (ref 0.0–0.7)
EOS PCT: 1 % (ref 0–5)
HEMATOCRIT: 34.8 % — AB (ref 36.0–46.0)
Hemoglobin: 11.6 g/dL — ABNORMAL LOW (ref 12.0–15.0)
LYMPHS ABS: 1.1 10*3/uL (ref 0.7–4.0)
Lymphocytes Relative: 39 % (ref 12–46)
MCH: 29.3 pg (ref 26.0–34.0)
MCHC: 33.3 g/dL (ref 30.0–36.0)
MCV: 87.9 fL (ref 78.0–100.0)
MONO ABS: 0.1 10*3/uL (ref 0.1–1.0)
Monocytes Relative: 5 % (ref 3–12)
Neutro Abs: 1.6 10*3/uL — ABNORMAL LOW (ref 1.7–7.7)
Neutrophils Relative %: 54 % (ref 43–77)
PLATELETS: 228 10*3/uL (ref 150–400)
RBC: 3.96 MIL/uL (ref 3.87–5.11)
RDW: 14.9 % (ref 11.5–15.5)
WBC: 2.9 10*3/uL — ABNORMAL LOW (ref 4.0–10.5)

## 2013-09-10 NOTE — Progress Notes (Signed)
GYNECOLOGY VISIT  PCP:  Nicky Pugh, MD  Referring provider:   HPI: 43 y.o.   Married  Caucasian  female   G2P2002 with Patient's last menstrual period was 09/05/2012.   here for   Annual exam. Has ParaGard IUD, due to come out next year.  PCP could not see IUD strings last year. Using condoms for back up.  Worried about where the IUD is. This is the patient's second ParaGard IUD.   Having an abdominoplasty on April 1.   History of neutropenia. Saw Hematology/Oncology and on that day CBC was normal.  Wants to do a CBC with diff today for upcoming surgery.   Hgb:  none Urine:   none  GYNECOLOGIC HISTORY: Patient's last menstrual period was 09/05/2012. Sexually active:  Yes  Partner preference:  Female  Contraception:   Paragard Menopausal hormone therapy:  no DES exposure:  no Blood transfusions:   no Sexually transmitted diseases:   no GYN procedures and prior surgeries: Cryosurgery Age 26 ;  LEEP Age 13.  Thinks she had two LEEPs. Last mammogram:  08/05/2012  Bi-rads 1 Last pap and high risk HPV testing 11/13/12:   WNL no HPV done. History of abnormal pap smear:  Yes Age 51's   OB History   Grav Para Term Preterm Abortions TAB SAB Ect Mult Living   2 2 2       2        LIFESTYLE: Exercise: Power Yoga 5x/wk             Tobacco: no Alcohol: no Drug use:   no  OTHER HEALTH MAINTENANCE: Tetanus/TDap: 5 years ago  Gardisil: no Influenza: September 2014 Zostavax: no  Bone density:  none Colonoscopy: none  Cholesterol check:  08/08/11 Normal  Family History  Problem Relation Age of Onset  . Hypertension Mother   . Transient ischemic attack Mother   . Cancer Mother     uterine, tah at 37  . Hyperlipidemia Mother   . Cancer Father     lung  . Other Father     coronary artery disease  . Heart disease Father 32    bypass, triple  . Diabetes Father   . Hyperlipidemia Father   . Hypertension Father   . Thyroid disease Father   . Obesity Sister   . Mental  illness Sister     bipolar  . Macular degeneration Maternal Grandmother   . Blindness Maternal Grandmother   . Alzheimer's disease Maternal Grandmother   . Cancer Maternal Grandmother     ovarian cancer  . Dementia Maternal Grandmother     late onset  . Other Maternal Grandfather     stent  . Heart disease Maternal Grandfather     stents, MI, pacer for sinus brady  . Aneurysm Paternal Grandfather     brain    Patient Active Problem List   Diagnosis Date Noted  . Neutropenia 11/06/2011  . Anemia 11/06/2011  . Mole (skin) 08/08/2011  . Abnormal cervical cytology 08/08/2011  . Preventative health care 08/08/2011  . Acne 08/08/2011  . Hypertension in pregnancy, preeclampsia/eclampsia/prior HTN/postp cond    Past Medical History  Diagnosis Date  . Chicken pox 25  . Hypertension in pregnancy, preeclampsia/eclampsia/prior HTN/postp cond   . Mole (skin) 08/08/2011  . Abnormal cervical cytology 08/08/2011  . Preventative health care 08/08/2011  . Acne 08/08/2011  . Neutropenia 11/06/2011  . Anemia 11/06/2011    Past Surgical History  Procedure Laterality Date  . Tonsillectomy  43 yrs old  . Leap    . Skin biopsy      right lower abdominal wall, benign  . Cervical biopsy  w/ loop electrode excision  Age 87    ALLERGIES: Review of patient's allergies indicates no known allergies.  Current Outpatient Prescriptions  Medication Sig Dispense Refill  . bimatoprost (LATISSE) 0.03 % ophthalmic solution Place 1 drop into both eyes at bedtime. Place one drop on applicator and apply evenly along the skin of the upper eyelid at base of eyelashes once daily at bedtime; repeat procedure for second eye (use a clean applicator).  3 mL  3  . ibuprofen (ADVIL,MOTRIN) 600 MG tablet Take 600 mg by mouth every 6 (six) hours as needed.      . IUD's (PARAGARD INTRAUTERINE COPPER) IUD IUD In place since birth of 21 year old son  1 each  0   No current facility-administered medications for this visit.      ROS:  Pertinent items are noted in HPI.  SOCIAL HISTORY:  Works for [employer].  Serves [employer].  PHYSICAL EXAMINATION:    BP 100/60  Pulse 72  Resp 14  Ht 5\' 5"  (1.651 m)  Wt 114 lb (51.71 kg)  BMI 18.97 kg/m2  LMP 09/05/2012   Wt Readings from Last 3 Encounters:  09/10/13 114 lb (51.71 kg)  03/07/13 127 lb (57.607 kg)  11/13/12 130 lb 4 oz (59.081 kg)     Ht Readings from Last 3 Encounters:  09/10/13 5\' 5"  (1.651 m)  03/07/13 5\' 4"  (1.626 m)  11/13/12 5\' 5"  (1.651 m)    General appearance: alert, cooperative and appears stated age Head: Normocephalic, without obvious abnormality, atraumatic Lungs: clear to auscultation bilaterally Heart: regular rate and rhythm Abdomen: soft, non-tender; no masses,  no organomegaly  No abnormal inguinal nodes palpated Neurologic: Grossly normal  Pelvic: External genitalia:  no lesions              Urethra:  normal appearing urethra with no masses, tenderness or lesions              Bartholins and Skenes: normal                 Vagina: normal appearing vagina with normal color and discharge, no lesions              Cervix: normal appearance.  IUD strings seen using an endocervical speculum.              Pap and high risk HPV testing done: no.            Bimanual Exam:  Uterus:  uterus is normal size, shape, consistency and nontender                                      Adnexa: normal adnexa in size, nontender and no masses                                        ASSESSMENT  Normal gynecologic exam. ParaGard IUD patient.  Need for reliable contraception. Upcoming abdominoplasty. History of neutropenia and anemia.  Wants to have her pre-op CBC with diff here. Mother with history of uterine cancer.  History of LEEP twice.  PLAN  Discussion about ParaGard, Mirena, and permanent sterilization -  vaginal and laparoscopic.   Handout on Mirena. Pap smear and high risk HPV testing yearly for 20 years following last  LEEP.  CBC with difff here today to facilitate patient's schedule. Annual exam in May. Return prn.   35 minutes face to face time of which over 50% was spent in counseling.    An After Visit Summary was printed and given to the patient.

## 2013-09-10 NOTE — Patient Instructions (Signed)
Please read the handout on the Mirena IUD. Let me know how I can help you with your contraceptive needs.

## 2013-09-11 ENCOUNTER — Other Ambulatory Visit: Payer: Self-pay | Admitting: Obstetrics and Gynecology

## 2013-09-11 DIAGNOSIS — D709 Neutropenia, unspecified: Secondary | ICD-10-CM

## 2013-09-12 ENCOUNTER — Other Ambulatory Visit: Payer: Self-pay | Admitting: *Deleted

## 2013-09-12 ENCOUNTER — Telehealth: Payer: Self-pay | Admitting: Emergency Medicine

## 2013-09-12 ENCOUNTER — Other Ambulatory Visit (HOSPITAL_BASED_OUTPATIENT_CLINIC_OR_DEPARTMENT_OTHER): Payer: BC Managed Care – PPO | Admitting: Lab

## 2013-09-12 ENCOUNTER — Encounter: Payer: Self-pay | Admitting: Obstetrics and Gynecology

## 2013-09-12 DIAGNOSIS — D709 Neutropenia, unspecified: Secondary | ICD-10-CM

## 2013-09-12 LAB — CBC WITH DIFFERENTIAL (CANCER CENTER ONLY)
BASO#: 0 10*3/uL (ref 0.0–0.2)
BASO%: 0.6 % (ref 0.0–2.0)
EOS%: 1.8 % (ref 0.0–7.0)
Eosinophils Absolute: 0.1 10*3/uL (ref 0.0–0.5)
HEMATOCRIT: 34.1 % — AB (ref 34.8–46.6)
HGB: 11.4 g/dL — ABNORMAL LOW (ref 11.6–15.9)
LYMPH#: 1.3 10*3/uL (ref 0.9–3.3)
LYMPH%: 39.7 % (ref 14.0–48.0)
MCH: 30 pg (ref 26.0–34.0)
MCHC: 33.4 g/dL (ref 32.0–36.0)
MCV: 90 fL (ref 81–101)
MONO#: 0.2 10*3/uL (ref 0.1–0.9)
MONO%: 6.8 % (ref 0.0–13.0)
NEUT#: 1.7 10*3/uL (ref 1.5–6.5)
NEUT%: 51.1 % (ref 39.6–80.0)
Platelets: 219 10*3/uL (ref 145–400)
RBC: 3.8 10*6/uL (ref 3.70–5.32)
RDW: 14.4 % (ref 11.1–15.7)
WBC: 3.3 10*3/uL — ABNORMAL LOW (ref 3.9–10.0)

## 2013-09-12 NOTE — Telephone Encounter (Signed)
Message copied by Michele Mcalpine on Fri Sep 12, 2013  3:21 PM ------      Message from: [place], [place]: Thu Sep 11, 2013  6:33 PM       Olivia Mackie,            I sent results tot he patient through My Chart.            She will need to see Hematology/Oncology for re-evaluation.  Please facilitate this appointment for her.       I will put in an order.             Danella Maiers. ------

## 2013-09-12 NOTE — Telephone Encounter (Signed)
Spoke with patient. She is in touch with Dr. Marin Olp and states she will call back if anything further required from our office.  Routing to provider for final review. Patient agreeable to disposition. Will close encounter

## 2013-09-14 ENCOUNTER — Encounter: Payer: Self-pay | Admitting: *Deleted

## 2013-09-15 ENCOUNTER — Telehealth: Payer: Self-pay | Admitting: Obstetrics and Gynecology

## 2013-09-15 ENCOUNTER — Telehealth: Payer: Self-pay | Admitting: Hematology & Oncology

## 2013-09-15 NOTE — Telephone Encounter (Signed)
Received a message from Campbell at referring wanting to know she faxed referral. I left her did not recwive. She resent it but pt is a current pt of ours. She had lab here last week. I gave to RN to triage

## 2013-09-15 NOTE — Telephone Encounter (Signed)
Re-faxed patient records

## 2013-09-15 NOTE — Telephone Encounter (Signed)
Rick with dr Antonieta Pert office at [facility] says he did not receive faxed records on pt. Fax number is [phone].

## 2013-09-18 ENCOUNTER — Telehealth: Payer: Self-pay | Admitting: Hematology & Oncology

## 2013-09-18 NOTE — Telephone Encounter (Signed)
Referring called wanted to know when we are seeing her again. Dr. Marin Olp wants to talk to them. I transferred call to him

## 2013-09-24 HISTORY — PX: ABDOMINOPLASTY: SHX5355

## 2013-12-24 ENCOUNTER — Ambulatory Visit: Payer: BC Managed Care – PPO | Admitting: Obstetrics and Gynecology

## 2014-03-10 ENCOUNTER — Telehealth: Payer: Self-pay | Admitting: Family Medicine

## 2014-03-10 DIAGNOSIS — L679 Hair color and hair shaft abnormality, unspecified: Secondary | ICD-10-CM

## 2014-03-10 MED ORDER — BIMATOPROST 0.03 % EX SOLN: 1.0000 [drp] | Freq: Every day | CUTANEOUS | Status: DC

## 2014-03-10 NOTE — Addendum Note (Signed)
Addended by: Ralph Dowdy on: 03/10/2014 02:11 PM   Modules accepted: Orders

## 2014-03-10 NOTE — Telephone Encounter (Signed)
Pt LMOM requesting rf of latisse eye drops.  Patient hasn't been seen by you since 11/13/12.  Please advise.

## 2014-03-10 NOTE — Telephone Encounter (Signed)
Ok to refill. Send as refill.

## 2014-03-16 ENCOUNTER — Telehealth: Payer: Self-pay

## 2014-03-16 NOTE — Telephone Encounter (Signed)
Flu vaccine documentation

## 2014-04-27 ENCOUNTER — Encounter: Payer: Self-pay | Admitting: Obstetrics and Gynecology

## 2014-08-13 ENCOUNTER — Encounter: Payer: Self-pay | Admitting: Nurse Practitioner

## 2014-08-13 ENCOUNTER — Ambulatory Visit (INDEPENDENT_AMBULATORY_CARE_PROVIDER_SITE_OTHER): Payer: BLUE CROSS/BLUE SHIELD | Admitting: Nurse Practitioner

## 2014-08-13 VITALS — BP 94/60 | HR 67 | Temp 97.5°F | Ht 65.0 in | Wt 112.0 lb

## 2014-08-13 DIAGNOSIS — R350 Frequency of micturition: Secondary | ICD-10-CM

## 2014-08-13 DIAGNOSIS — Z681 Body mass index (BMI) 19 or less, adult: Secondary | ICD-10-CM

## 2014-08-13 DIAGNOSIS — R0602 Shortness of breath: Secondary | ICD-10-CM | POA: Insufficient documentation

## 2014-08-13 DIAGNOSIS — D649 Anemia, unspecified: Secondary | ICD-10-CM

## 2014-08-13 DIAGNOSIS — R300 Dysuria: Secondary | ICD-10-CM

## 2014-08-13 DIAGNOSIS — Z0000 Encounter for general adult medical examination without abnormal findings: Secondary | ICD-10-CM

## 2014-08-13 DIAGNOSIS — L608 Other nail disorders: Secondary | ICD-10-CM

## 2014-08-13 LAB — CBC WITH DIFFERENTIAL/PLATELET
BASOS ABS: 0 10*3/uL (ref 0.0–0.1)
Basophils Relative: 0.6 % (ref 0.0–3.0)
Eosinophils Absolute: 0.1 10*3/uL (ref 0.0–0.7)
Eosinophils Relative: 2.2 % (ref 0.0–5.0)
HCT: 35.3 % — ABNORMAL LOW (ref 36.0–46.0)
Hemoglobin: 11.9 g/dL — ABNORMAL LOW (ref 12.0–15.0)
LYMPHS PCT: 34.9 % (ref 12.0–46.0)
Lymphs Abs: 0.9 10*3/uL (ref 0.7–4.0)
MCHC: 33.7 g/dL (ref 30.0–36.0)
MCV: 87.2 fl (ref 78.0–100.0)
MONOS PCT: 5.7 % (ref 3.0–12.0)
Monocytes Absolute: 0.2 10*3/uL (ref 0.1–1.0)
NEUTROS PCT: 56.6 % (ref 43.0–77.0)
Neutro Abs: 1.5 10*3/uL (ref 1.4–7.7)
PLATELETS: 246 10*3/uL (ref 150.0–400.0)
RBC: 4.05 Mil/uL (ref 3.87–5.11)
RDW: 15.1 % (ref 11.5–15.5)
WBC: 2.7 10*3/uL — ABNORMAL LOW (ref 4.0–10.5)

## 2014-08-13 LAB — POCT URINALYSIS DIPSTICK
Bilirubin, UA: NEGATIVE
Glucose, UA: NEGATIVE
KETONES UA: NEGATIVE
LEUKOCYTES UA: NEGATIVE
Nitrite, UA: NEGATIVE
PROTEIN UA: NEGATIVE
Spec Grav, UA: 1.02
Urobilinogen, UA: 0.2
pH, UA: 5.5

## 2014-08-13 LAB — VITAMIN D 25 HYDROXY (VIT D DEFICIENCY, FRACTURES): VITD: 43.56 ng/mL (ref 30.00–100.00)

## 2014-08-13 LAB — COMPREHENSIVE METABOLIC PANEL
ALT: 15 U/L (ref 0–35)
AST: 18 U/L (ref 0–37)
Albumin: 4.5 g/dL (ref 3.5–5.2)
Alkaline Phosphatase: 43 U/L (ref 39–117)
BILIRUBIN TOTAL: 0.7 mg/dL (ref 0.2–1.2)
BUN: 13 mg/dL (ref 6–23)
CO2: 27 mEq/L (ref 19–32)
Calcium: 9.4 mg/dL (ref 8.4–10.5)
Chloride: 103 mEq/L (ref 96–112)
Creatinine, Ser: 0.79 mg/dL (ref 0.40–1.20)
GFR: 84.19 mL/min (ref >60.00)
GLUCOSE: 90 mg/dL (ref 70–99)
Potassium: 4.6 mEq/L (ref 3.5–5.1)
Sodium: 135 mEq/L (ref 135–145)
TOTAL PROTEIN: 7.1 g/dL (ref 6.0–8.3)

## 2014-08-13 LAB — URINALYSIS, MICROSCOPIC ONLY

## 2014-08-13 LAB — FERRITIN: Ferritin: 6.4 ng/mL — ABNORMAL LOW (ref 10.0–291.0)

## 2014-08-13 LAB — LIPID PANEL
Cholesterol: 190 mg/dL (ref 0–200)
HDL: 97.7 mg/dL (ref >39.00)
LDL Cholesterol: 82 mg/dL (ref 0–99)
NONHDL: 92.3
Total CHOL/HDL Ratio: 2
Triglycerides: 50 mg/dL (ref 0.0–149.0)
VLDL: 10 mg/dL (ref 0.0–40.0)

## 2014-08-13 LAB — IRON AND TIBC
%SAT: 23 % (ref 20–55)
Iron: 89 ug/dL (ref 42–145)
TIBC: 383 ug/dL (ref 250–470)
UIBC: 294 ug/dL (ref 125–400)

## 2014-08-13 LAB — T4, FREE: Free T4: 0.95 ng/dL (ref 0.60–1.60)

## 2014-08-13 LAB — TSH: TSH: 1.32 u[IU]/mL (ref 0.35–4.50)

## 2014-08-13 NOTE — Progress Notes (Signed)
Subjective:     Susan Mcdaniel is a 44 y.o. female c/o 1 wk SOB, 1 day urinary frequency, and requests CPE. SOB: noticed SOB w/activities that do not usually cause SOB-walking up 3-4 stairs. Associated symptoms: fatigue in last week & felt jittery yesterday before lunch. Felt better after eating, but still not at baseline of feeling well.g. No changes in sleep, but more activities than usual. Denies cough, palpitations, LE edema. Exercises 5 days/week. Cardiac RF: mild anemia, underweight-intentionally lost 15 lbs in 1 yr. States she increased exercise & cut out red meat. She has Hx neutropenia & mild anemia w/low iron sat but nml TIBC. I referred her to onc for neut. Reviewed Dr Antonieta Pert note dated 03/07/13: w/u revealed transient leukopenia of unclear etiology. No f/u recommended. Labs were repeated when she saw gyn 09/10/13 Note & lab reviewed: pt was concerned about placement of IUD. She asked for labs as she was planning abdominoplasty: wbc 2.9, Hgb 11.6. These results were communicated to dr Antonieta Pert ofc. He repeated CBC 1 wk later: wbc 3.9, Hgb 11.4. Telephone messages reviewed- no problem to proceed w/surgery. She has not seen provider in 1 yr. And has felt well until last week. Frequency: started yesterday. Denies dysuria, flank pain, abd pain, fever, nausea, diarrhea, constipation. Last BM this am. No black stools. Preve care: She exercises regularly. Eats healthy diet. Does not take supplements. Gets reg womancare w/Dr Silva-paraguard due for removal this year. Nml Paps.   The following portions of the patient's history were reviewed and updated as appropriate: allergies, current medications, past medical history, past social history, past surgical history and problem list.  Review of Systems Constitutional: negative for fevers Ears, nose, mouth, throat, and face: negative for sore throat Neurological: negative for headaches Behavioral/Psych: negative for sleep disturbance Endocrine: positive  for temperature intolerance and hands & feet always cold, nail beds have been blue    Objective:    BP 94/60 mmHg  Pulse 67  Temp(Src) 97.5 F (36.4 C) (Oral)  Ht 5\' 5"  (1.651 m)  Wt 112 lb (50.803 kg)  BMI 18.64 kg/m2  SpO2 99% BP 94/60 mmHg  Pulse 67  Temp(Src) 97.5 F (36.4 C) (Oral)  Ht 5\' 5"  (1.651 m)  Wt 112 lb (50.803 kg)  BMI 18.64 kg/m2  SpO2 99% General appearance: alert, cooperative, appears stated age and no distress  Head: Normocephalic, without obvious abnormality, atraumatic Eyes: negative findings: lids and lashes normal, conjunctivae and sclerae normal, corneas clear and pupils equal, round, reactive to light and accomodation, positive findings: conjunctiva: pale Throat: normal findings: lips normal without lesions, teeth intact, non-carious, palate normal and tongue midline and normal and abnormal findings: mucosa & gums pale Neck: no carotid bruit, supple, symmetrical, trachea midline and thyroid not enlarged, symmetric, no tenderness/mass/nodules Lungs: clear to auscultation bilaterally Heart: regular rate and rhythm, S1, S2 normal, no murmur, click, rub or gallop Abdomen: soft, non-tender; bowel sounds normal; no masses,  no organomegaly Extremities: cold fingers from PIP to nails Pulses: 2+ and symmetric Skin: pale, nail beds blue & slightly clubbed Lymph nodes: Cervical adenopathy: none and Supraclavicular adenopathy: none Neurologic: Grossly normal    Assessment:Plan     1. Urinary frequency, New - POCT urinalysis dipstick-sm blood - Urine culture - Urine Microscopic If micro confirms blood, will start ABX  2. SOB (shortness of breath) on exertion, New DD: anemia, thyroid disease, cardio etio - T4, free - TSH - Comprehensive metabolic panel - CBC with Differential/Platelet - Iron and  TIBC - Ferritin  3. Preventative health care Eat minimum of 2000 cals daily Continue reg exercise Continue reg womancare exams w/gyn - Vit D  25 hydroxy (rtn  osteoporosis monitoring) - Lipid panel  4. Blue nail beds, New DD: anemia, raynaud's phenomenon - T4, free - TSH - Comprehensive metabolic panel - CBC with Differential/Platelet - Iron and TIBC - Ferritin  5. BMI less than 19, New Intentional wt loss:increased exercise & diet changes May be contributing to anemia DD: hyperthyroid Minimum daily cal intake should be 2000.  F/u PRN labs

## 2014-08-13 NOTE — Patient Instructions (Addendum)
Small amount in urine on office test-this is often falsely positive. I will call when I get microanalysis back. If it confirms blood, I will start antibiotic. My office will call with other lab results and follow up.

## 2014-08-13 NOTE — Progress Notes (Signed)
Pre visit review using our clinic review tool, if applicable. No additional management support is needed unless otherwise documented below in the visit note. 

## 2014-08-14 ENCOUNTER — Telehealth: Payer: Self-pay | Admitting: Nurse Practitioner

## 2014-08-14 NOTE — Telephone Encounter (Signed)
LMOVM for pt to return call 

## 2014-08-14 NOTE — Telephone Encounter (Signed)
Patient returned call and was given results. Patient expressed understanding. Patient scheduled appt for 09/04/14 at 8:00 am.

## 2014-08-14 NOTE — Telephone Encounter (Signed)
pls call pt: Advise Labs show low ferritin, but normal iron stores. Low ferritin is the first indicator that iron stores are being depleted. Low ferritin can cause tiredness although anemia is very mild & actually improved- 11.9. WBC are low again-not sure what to make of it since she had extensive w/u w/oncology last year. She should consider starting MV with iron, or eat black strap molasses daily-1 Tablespoon. Can mix with peanut butter. All other labs nml. Please schedule OV in 3 weeks. I will check labs again.

## 2014-08-15 LAB — URINE CULTURE
Colony Count: NO GROWTH
Organism ID, Bacteria: NO GROWTH

## 2014-08-17 ENCOUNTER — Telehealth: Payer: Self-pay | Admitting: Nurse Practitioner

## 2014-08-17 NOTE — Telephone Encounter (Signed)
pls call pt: Advise no UTI per urine culture. Ask if still having frequency.

## 2014-08-17 NOTE — Telephone Encounter (Signed)
LMOVM for pt to return call 

## 2014-08-20 NOTE — Telephone Encounter (Signed)
Patient returned call and was given results. Patient said that she is feeling better, no urine frequency.

## 2014-09-04 ENCOUNTER — Ambulatory Visit: Payer: BLUE CROSS/BLUE SHIELD | Admitting: Nurse Practitioner

## 2014-10-30 ENCOUNTER — Telehealth: Payer: Self-pay | Admitting: Obstetrics and Gynecology

## 2014-10-30 DIAGNOSIS — Z30433 Encounter for removal and reinsertion of intrauterine contraceptive device: Secondary | ICD-10-CM

## 2014-10-30 NOTE — Telephone Encounter (Signed)
Spoke with patient. Advised of message as seen below from Susan Mcdaniel. Patient is agreeable. Patient has placed call to MD who inserted IUD to see what day it was inserted. Appointment cancelled for June 3rd at this time. Patient to return call with date IUD was inserted for reschedule. Aware if after expiration will need to wait until next cycle. Patient is agreeable.

## 2014-10-30 NOTE — Telephone Encounter (Signed)
Left message to call Kaitlyn at [phone]. 

## 2014-10-30 NOTE — Telephone Encounter (Signed)
Spoke with patient. Patient states that she is ready to schedule her IUD removal and reinsertion. Patient currently has a Paraguard which was placed in April/May of 2006. Using condoms as back up method. Patient is requesting IUD removal and reinsertion for June 3rd. Patient may want Mirena IUD instead of another Paragard. Would like to discuss this with Dr.Silva before reinsertion. Appointment scheduled for 6/3 at [place] with Dr.Silva. Patient is agreeable to date and time. Pre procedure instructions given.  Motrin instructions given. Motrin=Advil=Ibuprofen, 800 mg one hour before appointment. Eat a meal and hydrate well before appointment. Patient is agreeable.  Dr.Silva, per review and in this instance patient will have expired Paragard but is unable to come in until June 3rd due to work. Needs to be schedule with her cycle?

## 2014-10-30 NOTE — Telephone Encounter (Signed)
Needs to have IUD placed while she is on her cycle.   Thanks.

## 2014-10-30 NOTE — Telephone Encounter (Signed)
Patient is ready to have IUD exchanged. Last seen 09/10/13.

## 2014-11-09 NOTE — Telephone Encounter (Signed)
Patient is overdue for her annual exam and mammogram.  Please have her also schedule this.

## 2014-11-09 NOTE — Telephone Encounter (Signed)
Spoke with patient. Patient states that she has not heard back from her previous provider regarding the date of her IUD insertion. Patient states "I am just coming off of my cycle right now and I am almost 017% certain I will be on my cycle on June 10th. I have that day off so I want to go ahead and schedule." Advised patient we can go ahead and schedule for June 10th but this needs to be done within first seven days of cycle and if her cycle does not come as expected we will need to reschedule. Patient is agreeable. Will monitor cycle and call to reschedule if June 10th is not within that seven day window. Appointment scheduled for June 10th at [place] with Dr.Miller as Darreld Mclean will be out of the office that day. Pre procedure instructions given.  Motrin instructions given. Motrin=Advil=Ibuprofen, 800 mg one hour before appointment. Eat a meal and hydrate well before appointment.   CC: Dr.Miller  Routing to provider for final review. Patient agreeable to disposition. Patient aware provider will review message and nurse will return call with any additional instructions or change of disposition. Will close encounter.

## 2014-11-10 NOTE — Telephone Encounter (Signed)
Spoke with patient. Advised of message as seen below from [place]. Patient is agreeable. Patient declines to schedule aex at this time as she does not have her calendar with her. Patient would like to call back to schedule. [facility].  Routing to provider for final review. Patient agreeable to disposition. Will close encounter.   Patient aware provider will review message and nurse will return call if any additional advice or change of disposition.

## 2014-11-27 ENCOUNTER — Ambulatory Visit: Payer: Self-pay | Admitting: Obstetrics and Gynecology

## 2014-12-03 ENCOUNTER — Telehealth: Payer: Self-pay | Admitting: Obstetrics and Gynecology

## 2014-12-03 NOTE — Telephone Encounter (Signed)
Pt called with a few questions about her procedure scheduled for 12/04/14 with Dr. Sabra Heck.

## 2014-12-04 ENCOUNTER — Encounter: Payer: Self-pay | Admitting: Obstetrics & Gynecology

## 2014-12-04 ENCOUNTER — Ambulatory Visit (INDEPENDENT_AMBULATORY_CARE_PROVIDER_SITE_OTHER): Payer: BLUE CROSS/BLUE SHIELD | Admitting: Obstetrics & Gynecology

## 2014-12-04 VITALS — BP 100/70 | HR 70 | Ht 65.0 in | Wt 114.0 lb

## 2014-12-04 DIAGNOSIS — Z01419 Encounter for gynecological examination (general) (routine) without abnormal findings: Secondary | ICD-10-CM

## 2014-12-04 DIAGNOSIS — Z30433 Encounter for removal and reinsertion of intrauterine contraceptive device: Secondary | ICD-10-CM

## 2014-12-04 DIAGNOSIS — Z124 Encounter for screening for malignant neoplasm of cervix: Secondary | ICD-10-CM | POA: Diagnosis not present

## 2014-12-04 LAB — POCT URINE PREGNANCY: PREG TEST UR: NEGATIVE

## 2014-12-04 NOTE — Progress Notes (Signed)
44 y.o. [id number] MarriedCaucasianF here for annual exam.  Needs IUD removal and replacement.  Pt has a ParaGuard and would prefer to keep using a ParaGuard.  Cycles regularly.    Patient's last menstrual period was 12/03/2014 (exact date).          Sexually active: Yes.    The current method of family planning is IUD.  Overdue for removal, son is 51, was inserted in [place] at first follow up post partum, no records Exercising: Yes.    yoga, walk, and hike Smoker:  Former smoker  Health Maintenance: Pap:  11/13/12 WNL, h/o LEEP in 20's History of abnormal Pap:  Yes age 52s MMG:  06/13/12 MMG, 08/05/12 left breast diag-routine screening in one year Colonoscopy:  none BMD:   none TDaP:  08/08/11 Screening Labs: PCP, Hb today: PCP, Urine today: PCP   reports that she quit smoking about 10 years ago. Her smoking use included Cigarettes. She has a 15 pack-year smoking history. She has never used smokeless tobacco. She reports that she drinks alcohol. She reports that she does not use illicit drugs.  Past Medical History  Diagnosis Date  . Chicken pox 25  . Hypertension in pregnancy, preeclampsia/eclampsia/prior HTN/postp cond   . Mole (skin) 08/08/2011  . Abnormal cervical cytology 08/08/2011  . Preventative health care 08/08/2011  . Acne 08/08/2011  . Neutropenia 11/06/2011    --saw hematologist at [place] in [facility]  . Anemia 11/06/2011  . History of HPV infection     Past Surgical History  Procedure Laterality Date  . Tonsillectomy  44 yrs old  . Leap    . Skin biopsy      right lower abdominal wall, benign  . Cervical biopsy  w/ loop electrode excision  Age 89  . Gynecologic cryosurgery  18  . Abdominal surgery  09-24-13    abdominoplasty    Current Outpatient Prescriptions  Medication Sig Dispense Refill  . bimatoprost (LATISSE) 0.03 % ophthalmic solution Place 1 drop into both eyes at bedtime. Place one drop on applicator and apply evenly along the skin of the upper eyelid  at base of eyelashes once daily at bedtime; repeat procedure for second eye (use a clean applicator). 3 mL 3  . ibuprofen (ADVIL,MOTRIN) 600 MG tablet Take 600 mg by mouth every 6 (six) hours as needed.    . IUD's (PARAGARD INTRAUTERINE COPPER) IUD IUD In place since birth of 53 year old son 1 each 0   No current facility-administered medications for this visit.    Family History  Problem Relation Age of Onset  . Hypertension Mother   . Transient ischemic attack Mother   . Cancer Mother     uterine, tah at 9  . Hyperlipidemia Mother   . Cancer Father     lung  . Other Father     coronary artery disease  . Heart disease Father 37    bypass, triple  . Diabetes Father   . Hyperlipidemia Father   . Hypertension Father   . Thyroid disease Father   . Obesity Sister   . Mental illness Sister     bipolar  . Macular degeneration Maternal Grandmother   . Blindness Maternal Grandmother   . Alzheimer's disease Maternal Grandmother   . Cancer Maternal Grandmother     ovarian cancer  . Dementia Maternal Grandmother     late onset  . Other Maternal Grandfather     stent  . Heart disease Maternal Grandfather  stents, MI, pacer for sinus brady  . Aneurysm Paternal Grandfather     brain    ROS:  Pertinent items are noted in HPI.  Otherwise, a comprehensive ROS was negative.  Exam:   BP 100/70 mmHg  Pulse 70  Ht 5\' 5"  (1.651 m)  Wt 114 lb (51.71 kg)  BMI 18.97 kg/m2  LMP 12/03/2014 (Exact Date)  Weight change: no change   Height: 5\' 5"  (165.1 cm)  Ht Readings from Last 3 Encounters:  12/04/14 5\' 5"  (1.651 m)  08/13/14 5\' 5"  (1.651 m)  09/10/13 5\' 5"  (1.651 m)    General appearance: alert, cooperative and appears stated age Head: Normocephalic, without obvious abnormality, atraumatic Neck: no adenopathy, supple, symmetrical, trachea midline and thyroid normal to inspection and palpation Lungs: clear to auscultation bilaterally Breasts: normal appearance, no masses or  tenderness Heart: regular rate and rhythm Abdomen: soft, non-tender; bowel sounds normal; no masses,  no organomegaly Extremities: extremities normal, atraumatic, no cyanosis or edema Skin: Skin color, texture, turgor normal. No rashes or lesions Lymph nodes: Cervical, supraclavicular, and axillary nodes normal. No abnormal inguinal nodes palpated Neurologic: Grossly normal   Pelvic: External genitalia:  no lesions              Urethra:  normal appearing urethra with no masses, tenderness or lesions              Bartholins and Skenes: normal                 Vagina: normal appearing vagina with normal color and discharge, no lesions              Cervix: no lesions              Pap taken: Yes.   Bimanual Exam:  Uterus:  normal size, contour, position, consistency, mobility, non-tender              Adnexa: normal adnexa and no mass, fullness, tenderness               Rectovaginal: Confirms               Anus:  normal sphincter tone, no lesions  Chaperone was present for exam.   Procedure:  Speculum inserted into vagina. Cervix visualized and cleansed with betadine solution X 3. Paracervical block placed:  no.   Tenaculum placed on cervix at 12 o'clock position.  IUD string identified and IUD removed with one pull.  Uterus sounded to 8 centimeters.  IUD and inserting device removed from sterile packet and under sterile conditions inserted to fundus of uterus.  IUD released and introducer removed without difficulty.  IUD string trimmed to 3 centimeters.  Remainder string given to patient to feel for identification.  Tenaculum removed.  Minimal bleeding noted.  Speculum removed.  Uterus palpated normal.  Patient tolerated procedure well.    Lot #:  [id number].  Exp: 09/2020.  Package information attached to consent and scanned into EPIC.  A:  Normal gyn exam ParaGard IUD removal and replacement today.  New one due to be removed 11/2024 History of neutropenia and anemia. Follow by Dr. Marin Olp.   Released 9/14. Remote hx of abnormal pap with LEEP x 2  P:   Screening MMG recommended.  Pt aware overdue. Pap with HR HPV New ParaGuard replaced today. Labs with PCP Return prn

## 2014-12-09 LAB — IPS PAP TEST WITH HPV

## 2015-01-18 ENCOUNTER — Other Ambulatory Visit: Payer: Self-pay | Admitting: *Deleted

## 2015-01-18 DIAGNOSIS — L679 Hair color and hair shaft abnormality, unspecified: Secondary | ICD-10-CM

## 2015-01-18 MED ORDER — BIMATOPROST 0.03 % EX SOLN: 1.0000 [drp] | Freq: Every day | CUTANEOUS | Status: DC

## 2015-01-18 NOTE — Telephone Encounter (Signed)
RF request for Latisse LOV: 08/13/14 Next ov: None Last written: 03/10/14 36ml w/ 3RF Please advise. Thanks.

## 2015-01-22 ENCOUNTER — Encounter: Payer: Self-pay | Admitting: Obstetrics & Gynecology

## 2015-01-22 ENCOUNTER — Ambulatory Visit (INDEPENDENT_AMBULATORY_CARE_PROVIDER_SITE_OTHER): Payer: BLUE CROSS/BLUE SHIELD | Admitting: Obstetrics & Gynecology

## 2015-01-22 VITALS — BP 106/58 | HR 64 | Resp 16 | Wt 113.0 lb

## 2015-01-22 DIAGNOSIS — Z30431 Encounter for routine checking of intrauterine contraceptive device: Secondary | ICD-10-CM

## 2015-01-22 NOTE — Progress Notes (Signed)
44 y.o. [id number] Married Caucasian female presents for IUD recheck.  Had ParaGard plafed 12/04/14.  Had minimal spotting after placement.  Has some questions about menopause and typical symptoms.  Not experiencing anything right now, just questions.  Spouse cannot feel strings.  No pain with intercourse.   LMP:  Patient's last menstrual period was 12/28/2014.  Gen:  WNWF healthy female NAD  Pelvic exam: Vulva:  normal female genitalia Vagina:  normal vagina Cervix:  Non-tender, Negative CMT, no lesions or redness, IUD string Uterus:  normal shape, position and consistency    A: IUD recheck, doing well  P: Return for AEX or prn for any new issues.

## 2015-04-22 ENCOUNTER — Other Ambulatory Visit: Payer: Self-pay | Admitting: Obstetrics and Gynecology

## 2015-04-22 DIAGNOSIS — Z1231 Encounter for screening mammogram for malignant neoplasm of breast: Secondary | ICD-10-CM

## 2015-05-13 ENCOUNTER — Ambulatory Visit (HOSPITAL_BASED_OUTPATIENT_CLINIC_OR_DEPARTMENT_OTHER)
Admission: RE | Admit: 2015-05-13 | Discharge: 2015-05-13 | Disposition: A | Discharge status: Home / Self Care | Payer: BLUE CROSS/BLUE SHIELD | Source: Ambulatory Visit | Attending: Obstetrics and Gynecology | Admitting: Obstetrics and Gynecology

## 2015-05-13 DIAGNOSIS — Z1231 Encounter for screening mammogram for malignant neoplasm of breast: Secondary | ICD-10-CM | POA: Insufficient documentation

## 2016-07-07 ENCOUNTER — Other Ambulatory Visit: Payer: Self-pay | Admitting: Obstetrics & Gynecology

## 2016-07-07 DIAGNOSIS — Z1231 Encounter for screening mammogram for malignant neoplasm of breast: Secondary | ICD-10-CM

## 2016-07-14 ENCOUNTER — Inpatient Hospital Stay (HOSPITAL_BASED_OUTPATIENT_CLINIC_OR_DEPARTMENT_OTHER): Admission: RE | Admit: 2016-07-14 | Payer: BLUE CROSS/BLUE SHIELD | Source: Ambulatory Visit

## 2016-07-21 ENCOUNTER — Ambulatory Visit
Admission: RE | Admit: 2016-07-21 | Discharge: 2016-07-21 | Disposition: A | Discharge status: Home / Self Care | Payer: BLUE CROSS/BLUE SHIELD | Source: Ambulatory Visit | Attending: Obstetrics & Gynecology | Admitting: Obstetrics & Gynecology

## 2016-07-21 DIAGNOSIS — Z1231 Encounter for screening mammogram for malignant neoplasm of breast: Secondary | ICD-10-CM

## 2016-08-03 ENCOUNTER — Telehealth: Payer: Self-pay | Admitting: Obstetrics & Gynecology

## 2016-08-03 NOTE — Telephone Encounter (Signed)
Left patient a message to call back to reschedule a future appointment that was cancelled by the provider for an AEX. °

## 2016-08-11 ENCOUNTER — Ambulatory Visit: Payer: BLUE CROSS/BLUE SHIELD | Admitting: Obstetrics & Gynecology

## 2016-08-17 NOTE — Progress Notes (Signed)
46 y.o. [id number] Married Caucasian F here for annual exam.  Doing well.  Cycles are not as regular as they have been.  Cycles typically last 3-6 weeks and when it's a little late, flow is heavy.      Patient's last menstrual period was 07/29/2016.          Sexually active: Yes.    The current method of family planning is IUD.    Exercising: Yes.    power yoga, HIIT, walking Smoker:  Former smoker   Health Maintenance: Pap:  12/04/14 negative, HR HPV negative, 11/13/12 negative  History of abnormal Pap:  yes MMG:  07/21/16 BIRADS 1 negative  Colonoscopy:  never BMD:   never TDaP:  08/08/11  Hep C testing: not indicated  Screening Labs: discuss today; patient fasting   reports that she quit smoking about 12 years ago. Her smoking use included Cigarettes. She has a 15.00 pack-year smoking history. She has never used smokeless tobacco. She reports that she drinks alcohol. She reports that she does not use drugs.  Past Medical History:  Diagnosis Date  . Abnormal cervical cytology 08/08/2011  . Acne 08/08/2011  . Anemia 11/06/2011  . Chicken pox 25  . History of HPV infection   . Hypertension in pregnancy, preeclampsia/eclampsia/prior HTN/postp cond   . Mole (skin) 08/08/2011  . Neutropenia 11/06/2011   --saw hematologist at [place] in [facility]  . Preventative health care 08/08/2011    Past Surgical History:  Procedure Laterality Date  . ABDOMINOPLASTY  09-24-13   Dr Stephanie Coup  . CERVICAL BIOPSY  W/ LOOP ELECTRODE EXCISION  Age 78  . GYNECOLOGIC CRYOSURGERY  18  . leap    . SKIN BIOPSY     right lower abdominal wall, benign  . TONSILLECTOMY  46 yrs old    Current Outpatient Prescriptions  Medication Sig Dispense Refill  . ibuprofen (ADVIL,MOTRIN) 600 MG tablet Take 600 mg by mouth every 6 (six) hours as needed.    . IUD's (PARAGARD INTRAUTERINE COPPER) IUD IUD In place since birth of 49 year old son 1 each 0  . Nutritional Supplements (JUICE PLUS FIBRE PO) Take by mouth daily.      No current facility-administered medications for this visit.     Family History  Problem Relation Age of Onset  . Hypertension Mother   . Transient ischemic attack Mother   . Cancer Mother     uterine, tah at 34  . Hyperlipidemia Mother   . Cancer Father     lung  . Other Father     coronary artery disease  . Heart disease Father 77    bypass, triple  . Diabetes Father   . Hyperlipidemia Father   . Hypertension Father   . Thyroid disease Father   . Obesity Sister   . Mental illness Sister     bipolar  . Macular degeneration Maternal Grandmother   . Blindness Maternal Grandmother   . Alzheimer's disease Maternal Grandmother   . Cancer Maternal Grandmother     ovarian cancer  . Dementia Maternal Grandmother     late onset  . Other Maternal Grandfather     stent  . Heart disease Maternal Grandfather     stents, MI, pacer for sinus brady  . Aneurysm Paternal Grandfather     brain    ROS:  Pertinent items are noted in HPI.  Otherwise, a comprehensive ROS was negative.  Exam:   BP 100/60 (BP Location: Right Arm,  Patient Position: Sitting, Cuff Size: Normal)   Pulse 68   Resp 16   Ht 5\' 5"  (1.651 m)   Wt 113 lb (51.3 kg)   LMP 07/29/2016   BMI 18.80 kg/m   Height: 5\' 5"  (165.1 cm)  Ht Readings from Last 3 Encounters:  08/18/16 5\' 5"  (1.651 m)  12/04/14 5\' 5"  (1.651 m)  08/13/14 5\' 5"  (1.651 m)    General appearance: alert, cooperative and appears stated age Head: Normocephalic, without obvious abnormality, atraumatic Neck: no adenopathy, supple, symmetrical, trachea midline and thyroid normal to inspection and palpation Lungs: clear to auscultation bilaterally Breasts: normal appearance, no masses or tenderness Heart: regular rate and rhythm Abdomen: soft, non-tender; bowel sounds normal; no masses,  no organomegaly Extremities: extremities normal, atraumatic, no cyanosis or edema Skin: Skin color, texture, turgor normal. No rashes or lesions Lymph nodes:  Cervical, supraclavicular, and axillary nodes normal. No abnormal inguinal nodes palpated Neurologic: Grossly normal   Pelvic: External genitalia:  no lesions              Urethra:  normal appearing urethra with no masses, tenderness or lesions              Bartholins and Skenes: normal                 Vagina: normal appearing vagina with normal color and discharge, no lesions              Cervix: no lesions and IUD string noted              Pap taken: Yes.   Bimanual Exam:  Uterus:  normal size, contour, position, consistency, mobility, non-tender              Adnexa: normal adnexa and no mass, fullness, tenderness               Rectovaginal: Confirms               Anus:  normal sphincter tone, no lesions  Chaperone was present for exam.  A:  Well Woman with normal exam Mild irregular bleeding this past year, likely perimeonpausal Paragard IUD placed 11/2014 H/O neutropenia and mild anemia.  Has been seen by Dr. Marin Olp.  Released 9/14 Remote hx of abnormal pap smear with LEEP x 2  P:   Mammogram yearly.  3D recommended due to breast density pap smear obtained.  Neg HR HPV 2016 CBC with diff, CMP, Lipids, TSH, Vit D FSH obtained today return annually or prn

## 2016-08-18 ENCOUNTER — Encounter: Payer: Self-pay | Admitting: Obstetrics & Gynecology

## 2016-08-18 ENCOUNTER — Ambulatory Visit (INDEPENDENT_AMBULATORY_CARE_PROVIDER_SITE_OTHER): Payer: BLUE CROSS/BLUE SHIELD | Admitting: Obstetrics & Gynecology

## 2016-08-18 VITALS — BP 100/60 | HR 68 | Resp 16 | Ht 65.0 in | Wt 113.0 lb

## 2016-08-18 DIAGNOSIS — Z124 Encounter for screening for malignant neoplasm of cervix: Secondary | ICD-10-CM | POA: Diagnosis not present

## 2016-08-18 DIAGNOSIS — Z0000 Encounter for general adult medical examination without abnormal findings: Secondary | ICD-10-CM

## 2016-08-18 DIAGNOSIS — Z01419 Encounter for gynecological examination (general) (routine) without abnormal findings: Secondary | ICD-10-CM | POA: Diagnosis not present

## 2016-08-18 DIAGNOSIS — N926 Irregular menstruation, unspecified: Secondary | ICD-10-CM

## 2016-08-18 LAB — COMPREHENSIVE METABOLIC PANEL
ALBUMIN: 4.6 g/dL (ref 3.6–5.1)
ALK PHOS: 36 U/L (ref 33–115)
ALT: 11 U/L (ref 6–29)
AST: 16 U/L (ref 10–35)
BILIRUBIN TOTAL: 0.6 mg/dL (ref 0.2–1.2)
BUN: 9 mg/dL (ref 7–25)
CHLORIDE: 105 mmol/L (ref 98–110)
CO2: 24 mmol/L (ref 20–31)
CREATININE: 0.87 mg/dL (ref 0.50–1.10)
Calcium: 9.4 mg/dL (ref 8.6–10.2)
Glucose, Bld: 90 mg/dL (ref 65–99)
Potassium: 4.4 mmol/L (ref 3.5–5.3)
SODIUM: 139 mmol/L (ref 135–146)
TOTAL PROTEIN: 7.2 g/dL (ref 6.1–8.1)

## 2016-08-18 LAB — LIPID PANEL
CHOLESTEROL: 190 mg/dL (ref <200)
HDL: 96 mg/dL (ref >50)
LDL Cholesterol: 82 mg/dL (ref <100)
TRIGLYCERIDES: 61 mg/dL (ref <150)
Total CHOL/HDL Ratio: 2 Ratio (ref <5.0)
VLDL: 12 mg/dL (ref <30)

## 2016-08-18 LAB — CBC WITH DIFFERENTIAL/PLATELET
BASOS ABS: 41 {cells}/uL (ref 0–200)
Basophils Relative: 1 %
EOS PCT: 2 %
Eosinophils Absolute: 82 cells/uL (ref 15–500)
HCT: 38.3 % (ref 35.0–45.0)
HEMOGLOBIN: 12.4 g/dL (ref 11.7–15.5)
LYMPHS ABS: 943 {cells}/uL (ref 850–3900)
LYMPHS PCT: 23 %
MCH: 30.2 pg (ref 27.0–33.0)
MCHC: 32.4 g/dL (ref 32.0–36.0)
MCV: 93.2 fL (ref 80.0–100.0)
MPV: 10.6 fL (ref 7.5–12.5)
Monocytes Absolute: 246 cells/uL (ref 200–950)
Monocytes Relative: 6 %
Neutro Abs: 2788 cells/uL (ref 1500–7800)
Neutrophils Relative %: 68 %
Platelets: 227 10*3/uL (ref 140–400)
RBC: 4.11 MIL/uL (ref 3.80–5.10)
RDW: 13.2 % (ref 11.0–15.0)
WBC: 4.1 10*3/uL (ref 3.8–10.8)

## 2016-08-18 LAB — TSH: TSH: 1.42 mIU/L

## 2016-08-19 LAB — FOLLICLE STIMULATING HORMONE: FSH: 11 m[IU]/mL

## 2016-08-19 LAB — VITAMIN D 25 HYDROXY (VIT D DEFICIENCY, FRACTURES): VIT D 25 HYDROXY: 40 ng/mL (ref 30–100)

## 2016-08-21 LAB — IPS PAP TEST WITH REFLEX TO HPV

## 2016-08-23 ENCOUNTER — Encounter: Payer: Self-pay | Admitting: Family Medicine

## 2016-08-23 ENCOUNTER — Ambulatory Visit (INDEPENDENT_AMBULATORY_CARE_PROVIDER_SITE_OTHER): Payer: BLUE CROSS/BLUE SHIELD | Admitting: Family Medicine

## 2016-08-23 VITALS — BP 101/64 | HR 71 | Temp 98.0°F | Resp 20 | Wt 116.5 lb

## 2016-08-23 DIAGNOSIS — J0100 Acute maxillary sinusitis, unspecified: Secondary | ICD-10-CM

## 2016-08-23 MED ORDER — DOXYCYCLINE HYCLATE 100 MG PO TABS: 100.0000 mg | ORAL_TABLET | Freq: Two times a day (BID) | ORAL | 0 refills | Status: DC

## 2016-08-23 NOTE — Patient Instructions (Signed)
Continue Mucinex and nettie pots. Use flonase.  Start doxycyline every 12 hours for 10 days.    Sinusitis, Adult Sinusitis is soreness and inflammation of your sinuses. Sinuses are hollow spaces in the bones around your face. Your sinuses are located:  Around your eyes.  In the middle of your forehead.  Behind your nose.  In your cheekbones. Your sinuses and nasal passages are lined with a stringy fluid (mucus). Mucus normally drains out of your sinuses. When your nasal tissues become inflamed or swollen, the mucus can become trapped or blocked so air cannot flow through your sinuses. This allows bacteria, viruses, and funguses to grow, which leads to infection. Sinusitis can develop quickly and last for 7?10 days (acute) or for more than 12 weeks (chronic). Sinusitis often develops after a cold. What are the causes? This condition is caused by anything that creates swelling in the sinuses or stops mucus from draining, including:  Allergies.  Asthma.  Bacterial or viral infection.  Abnormally shaped bones between the nasal passages.  Nasal growths that contain mucus (nasal polyps).  Narrow sinus openings.  Pollutants, such as chemicals or irritants in the air.  A foreign object stuck in the nose.  A fungal infection. This is rare. What increases the risk? The following factors may make you more likely to develop this condition:  Having allergies or asthma.  Having had a recent cold or respiratory tract infection.  Having structural deformities or blockages in your nose or sinuses.  Having a weak immune system.  Doing a lot of swimming or diving.  Overusing nasal sprays.  Smoking. What are the signs or symptoms? The main symptoms of this condition are pain and a feeling of pressure around the affected sinuses. Other symptoms include:  Upper toothache.  Earache.  Headache.  Bad breath.  Decreased sense of smell and taste.  A cough that may get worse at  night.  Fatigue.  Fever.  Thick drainage from your nose. The drainage is often green and it may contain pus (purulent).  Stuffy nose or congestion.  Postnasal drip. This is when extra mucus collects in the throat or back of the nose.  Swelling and warmth over the affected sinuses.  Sore throat.  Sensitivity to light. How is this diagnosed? This condition is diagnosed based on symptoms, a medical history, and a physical exam. To find out if your condition is acute or chronic, your health care provider may:  Look in your nose for signs of nasal polyps.  Tap over the affected sinus to check for signs of infection.  View the inside of your sinuses using an imaging device that has a light attached (endoscope). If your health care provider suspects that you have chronic sinusitis, you may also:  Be tested for allergies.  Have a sample of mucus taken from your nose (nasal culture) and checked for bacteria.  Have a mucus sample examined to see if your sinusitis is related to an allergy. If your sinusitis does not respond to treatment and it lasts longer than 8 weeks, you may have an MRI or CT scan to check your sinuses. These scans also help to determine how severe your infection is. In rare cases, a bone biopsy may be done to rule out more serious types of fungal sinus disease. How is this treated? Treatment for sinusitis depends on the cause and whether your condition is chronic or acute. If a virus is causing your sinusitis, your symptoms will go away on their  own within 10 days. You may be given medicines to relieve your symptoms, including:  Topical nasal decongestants. They shrink swollen nasal passages and let mucus drain from your sinuses.  Antihistamines. These drugs block inflammation that is triggered by allergies. This can help to ease swelling in your nose and sinuses.  Topical nasal corticosteroids. These are nasal sprays that ease inflammation and swelling in your nose  and sinuses.  Nasal saline washes. These rinses can help to get rid of thick mucus in your nose. If your condition is caused by bacteria, you will be given an antibiotic medicine. If your condition is caused by a fungus, you will be given an antifungal medicine. Surgery may be needed to correct underlying conditions, such as narrow nasal passages. Surgery may also be needed to remove polyps. Follow these instructions at home: Medicines   Take, use, or apply over-the-counter and prescription medicines only as told by your health care provider. These may include nasal sprays.  If you were prescribed an antibiotic medicine, take it as told by your health care provider. Do not stop taking the antibiotic even if you start to feel better. Hydrate and Humidify   Drink enough water to keep your urine clear or pale yellow. Staying hydrated will help to thin your mucus.  Use a cool mist humidifier to keep the humidity level in your home above 50%.  Inhale steam for 10-15 minutes, 3-4 times a day or as told by your health care provider. You can do this in the bathroom while a hot shower is running.  Limit your exposure to cool or dry air. Rest   Rest as much as possible.  Sleep with your head raised (elevated).  Make sure to get enough sleep each night. General instructions   Apply a warm, moist washcloth to your face 3-4 times a day or as told by your health care provider. This will help with discomfort.  Wash your hands often with soap and water to reduce your exposure to viruses and other germs. If soap and water are not available, use hand sanitizer.  Do not smoke. Avoid being around people who are smoking (secondhand smoke).  Keep all follow-up visits as told by your health care provider. This is important. Contact a health care provider if:  You have a fever.  Your symptoms get worse.  Your symptoms do not improve within 10 days. Get help right away if:  You have a severe  headache.  You have persistent vomiting.  You have pain or swelling around your face or eyes.  You have vision problems.  You develop confusion.  Your neck is stiff.  You have trouble breathing. This information is not intended to replace advice given to you by your health care provider. Make sure you discuss any questions you have with your health care provider. Document Released: 06/12/2005 Document Revised: 02/06/2016 Document Reviewed: 04/07/2015 Elsevier Interactive Patient Education  2017 [employer].

## 2016-08-23 NOTE — Progress Notes (Signed)
Susan Mcdaniel , [birth year], 46 y.o., female MRN: [id number] Patient Care Team    Relationship Specialty Notifications Start End  Ma Hillock, DO PCP - General Family Medicine  08/23/16   Megan Salon, MD Consulting Physician Gynecology  08/23/16   Volanda Napoleon, MD Consulting Physician Oncology  08/23/16    Comment: neutropenia    CC: sinus congestion Subjective: Pt presents for an acute OV with complaints of sinus congestion of 2 weeks duration.  Associated symptoms include sinus congestion, yellow/bloody nose drainage and dizziness. She states she has been using mucinex and her nettie pot until the weekend and thought she was getting better. On Monday she noticed the symptoms returned and worse. No Known Allergies Social History  Substance Use Topics  . Smoking status: Former Smoker    Packs/day: 1.00    Years: 15.00    Types: Cigarettes    Quit date: 06/26/2004  . Smokeless tobacco: Never Used  . Alcohol use 0.0 oz/week     Comment: occasionally   Past Medical History:  Diagnosis Date  . Abnormal cervical cytology 08/08/2011  . Acne 08/08/2011  . Anemia 11/06/2011  . Chicken pox 25  . History of HPV infection   . Hypertension in pregnancy, preeclampsia/eclampsia/prior HTN/postp cond   . Mole (skin) 08/08/2011  . Neutropenia 11/06/2011   --saw hematologist at [place] in [facility]   Past Surgical History:  Procedure Laterality Date  . ABDOMINOPLASTY  09-24-13   Dr Stephanie Coup  . CERVICAL BIOPSY  W/ LOOP ELECTRODE EXCISION  Age 74  . GYNECOLOGIC CRYOSURGERY  18  . leap    . SKIN BIOPSY     right lower abdominal wall, benign  . TONSILLECTOMY  46 yrs old   Family History  Problem Relation Age of Onset  . Hypertension Mother   . Transient ischemic attack Mother   . Cancer Mother     uterine, tah at 75  . Hyperlipidemia Mother   . Mental illness Mother   . Cancer Father     lung  . Other Father     coronary artery disease  . Heart disease Father 20    bypass, triple   . Diabetes Father   . Hyperlipidemia Father   . Hypertension Father   . Thyroid disease Father   . Mental illness Father   . Obesity Sister   . Mental illness Sister     bipolar  . Drug abuse Sister   . Macular degeneration Maternal Grandmother   . Blindness Maternal Grandmother   . Alzheimer's disease Maternal Grandmother   . Cancer Maternal Grandmother     ovarian cancer  . Dementia Maternal Grandmother     late onset  . Other Maternal Grandfather     stent  . Heart disease Maternal Grandfather     stents, MI, pacer for sinus brady  . Arthritis Maternal Grandfather   . Aneurysm Paternal Grandfather     brain   Allergies as of 08/23/2016   No Known Allergies     Medication List       Accurate as of 08/23/16  1:33 PM. Always use your most recent med list.          doxycycline 100 MG tablet Commonly known as:  VIBRA-TABS Take 1 tablet (100 mg total) by mouth 2 (two) times daily.   ibuprofen 600 MG tablet Commonly known as:  ADVIL,MOTRIN Take 600 mg by mouth every 6 (six) hours as needed.  JUICE PLUS FIBRE PO Take by mouth daily.   PARAGARD INTRAUTERINE COPPER Iud IUD In place since birth of 80 year old son       No results found for this or any previous visit (from the past 56 hour(s)). No results found.   ROS: Negative, with the exception of above mentioned in HPI   Objective:  BP 101/64 (BP Location: Right Arm, Patient Position: Sitting, Cuff Size: Normal)   Pulse 71   Temp 98 F (36.7 C)   Resp 20   Wt 116 lb 8 oz (52.8 kg)   LMP 07/29/2016   SpO2 100%   BMI 19.39 kg/m  Body mass index is 19.39 kg/m. Gen: Afebrile. No acute distress. Nontoxic in appearance, well developed, well nourished. Pleasant caucasian female.  HENT: AT. [place]. Bilateral TM visualized with right ear fullness. MMM, no oral lesions. Bilateral nares with erythema, dried blood, drainage. Throat without erythema or exudates. No cough or hoarseness, TTP max sinus.  Eyes:Pupils  Equal Round Reactive to light, Extraocular movements intact,  Conjunctiva without redness, discharge or icterus. Neck/lymp/endocrine: Supple,no lymphadenopathy CV: RRR Chest: CTAB, no wheeze or crackles. Good air movement, normal resp effort.  Neuro: Normal gait. PERLA. EOMi. Alert. Oriented x3    Assessment/Plan: MARCHE GRANIER is a 46 y.o. female present for acute OV for  1. Acute maxillary sinusitis, recurrence not specified - rest, hydrate. Flonase, mucinex, nettie pot.  - Doxy BID x 10 days - f/u PRN  electronically signed by:  Susan Pouch, DO  [place]

## 2016-09-19 ENCOUNTER — Encounter: Payer: Self-pay | Admitting: Physician Assistant

## 2016-09-19 ENCOUNTER — Ambulatory Visit (INDEPENDENT_AMBULATORY_CARE_PROVIDER_SITE_OTHER): Payer: BLUE CROSS/BLUE SHIELD | Admitting: Physician Assistant

## 2016-09-19 ENCOUNTER — Telehealth: Payer: Self-pay | Admitting: Family Medicine

## 2016-09-19 VITALS — BP 102/62 | HR 80 | Temp 99.5°F | Resp 14 | Ht 65.0 in | Wt 114.0 lb

## 2016-09-19 DIAGNOSIS — J019 Acute sinusitis, unspecified: Secondary | ICD-10-CM | POA: Diagnosis not present

## 2016-09-19 DIAGNOSIS — R509 Fever, unspecified: Secondary | ICD-10-CM

## 2016-09-19 DIAGNOSIS — B9689 Other specified bacterial agents as the cause of diseases classified elsewhere: Secondary | ICD-10-CM | POA: Diagnosis not present

## 2016-09-19 LAB — POCT INFLUENZA A: Rapid Influenza A Ag: NEGATIVE

## 2016-09-19 MED ORDER — AMOXICILLIN-POT CLAVULANATE 875-125 MG PO TABS: 1.0000 | ORAL_TABLET | Freq: Two times a day (BID) | ORAL | 0 refills | Status: DC

## 2016-09-19 MED ORDER — BENZONATATE 100 MG PO CAPS: 100.0000 mg | ORAL_CAPSULE | Freq: Two times a day (BID) | ORAL | 0 refills | Status: DC | PRN

## 2016-09-19 NOTE — Telephone Encounter (Signed)
Spoke with patient advised we cant diagnose over the phone and recommend she schedule an appt for evaluation . Diane to check for appt availability.

## 2016-09-19 NOTE — Progress Notes (Signed)
Patient presents to clinic today c/o recurrence of sinus pressure, sinus headache, nasal congestion, sore throat and a dry cough x 1 day. Also notes aches. Tmax 99.5. Denies chest congestion, chest pain or SOB. Denies recent travel. Denies sick contact. Has had the flu shot.  Of note, was seen and treated for sinusitis 4 weeks ago by PCP. Endorses taking entire course of doxycycline. Notes slight recurrence of symptoms (nasal congestion and sinus pressure) after completion of antibiotic that have been present since.   Past Medical History:  Diagnosis Date  . Abnormal cervical cytology 08/08/2011  . Acne 08/08/2011  . Anemia 11/06/2011  . Chicken pox 25  . History of HPV infection   . Hypertension in pregnancy, preeclampsia/eclampsia/prior HTN/postp cond   . Mole (skin) 08/08/2011  . Neutropenia 11/06/2011   --saw hematologist at [place] in [facility]    Current Outpatient Prescriptions on File Prior to Visit  Medication Sig Dispense Refill  . ibuprofen (ADVIL,MOTRIN) 600 MG tablet Take 600 mg by mouth every 6 (six) hours as needed.    . IUD's (PARAGARD INTRAUTERINE COPPER) IUD IUD In place since birth of 83 year old son 1 each 0  . Nutritional Supplements (JUICE PLUS FIBRE PO) Take by mouth daily.     No current facility-administered medications on file prior to visit.     No Known Allergies  Family History  Problem Relation Age of Onset  . Hypertension Mother   . Transient ischemic attack Mother   . Cancer Mother     uterine, tah at 63  . Hyperlipidemia Mother   . Mental illness Mother   . Cancer Father     lung  . Other Father     coronary artery disease  . Heart disease Father 31    bypass, triple  . Diabetes Father   . Hyperlipidemia Father   . Hypertension Father   . Thyroid disease Father   . Mental illness Father   . Obesity Sister   . Mental illness Sister     bipolar  . Drug abuse Sister   . Macular degeneration Maternal Grandmother   . Blindness Maternal  Grandmother   . Alzheimer's disease Maternal Grandmother   . Cancer Maternal Grandmother     ovarian cancer  . Dementia Maternal Grandmother     late onset  . Other Maternal Grandfather     stent  . Heart disease Maternal Grandfather     stents, MI, pacer for sinus brady  . Arthritis Maternal Grandfather   . Aneurysm Paternal Grandfather     brain    Social History   Social History  . Marital status: Married    Spouse name: [name]  . Number of children: 2  . Years of education: BSN   Occupational History  . Medical Device sales    Social History Main Topics  . Smoking status: Former Smoker    Packs/day: 1.00    Years: 15.00    Types: Cigarettes    Quit date: 06/26/2004  . Smokeless tobacco: Never Used  . Alcohol use 0.0 oz/week     Comment: occasionally  . Drug use: No  . Sexual activity: Yes    Partners: Male    Birth control/ protection: IUD     Comment: Paragard   Other Topics Concern  . None   Social History Narrative   Married to [place]. 2 children Gwyndolyn Saxon and [place].    BSN- Programmer, applications.    Drinks caffeine.  Herbal remedies, daily vitamin.   Wears her seatbelt. Smoke detector in the home.    Exercises routinely.    Diet: no beef or pork.    Feels safe in her relationships.       Review of Systems - See HPI.  All other ROS are negative.  BP 102/62   Pulse 80   Temp 99.5 F (37.5 C) (Oral)   Resp 14   Ht '5\' 5"'  (1.651 m)   Wt 114 lb (51.7 kg)   SpO2 99%   BMI 18.97 kg/m   Physical Exam  Constitutional: She is oriented to person, place, and time and well-developed, well-nourished, and in no distress.  HENT:  Head: Normocephalic and atraumatic.  Right Ear: A middle ear effusion is present.  Left Ear: A middle ear effusion is present.  Nose: Mucosal edema present. No rhinorrhea. Right sinus exhibits maxillary sinus tenderness. Right sinus exhibits no frontal sinus tenderness. Left sinus exhibits maxillary sinus tenderness. Left sinus exhibits  no frontal sinus tenderness.  Mouth/Throat: Uvula is midline, oropharynx is clear and moist and mucous membranes are normal.  Eyes: Conjunctivae are normal.  Neck: Neck supple.  Cardiovascular: Normal rate, regular rhythm, normal heart sounds and intact distal pulses.   Pulmonary/Chest: Effort normal and breath sounds normal. No respiratory distress. She has no wheezes. She has no rales. She exhibits no tenderness.  Lymphadenopathy:    She has no cervical adenopathy.  Neurological: She is alert and oriented to person, place, and time.  Skin: Skin is warm and dry. No rash noted.  Psychiatric: Affect normal.  Vitals reviewed.  Recent Results (from the past 2160 hour(s))  PAP with Reflex to HPV (IPS)     Status: None   Collection Time: 08/18/16 10:33 AM  Result Value Ref Range   COMMENTS: [facility]     Comment: [place], [place] [address] [place] [address] GYN CYTOLOGY REPORT  PATIENT NAME:Mcdaniel, Susan C PATHOLOGY#:[id number]: F DOB: April 30, 1971 (Age: 71) MEDICAL RECORD VOZDGU:[id number] DOCTOR:Suzanne Sabra Heck, M.D. DATE OBTAINED:2/23/2018CLIENT:[facility] RECEIVED:2/26/2018OTHER PHYS: DATE SIGNED:08/21/2016 PAP Thinlayer with Reflex to HPV Final Cytologic Interpretation:       Cervical, ThinLayer with Automated Imaging and Dual Review, CPT 88175      Negative for Intraepithelial Lesions or Malignancy.       ADEQUACY OF SPECIMEN:           Satisfactory for evaluation. No Endocervical cells/transformation zone component identified.             NOTE: This Pap test has been evaluated with computer assisted technology.       Electronically signed by: CMT, CT(ASCP), [address], [place], [place], (Med. Dir.: Sandrea Hughs, MD) cmt/2/26/2018The Pap test is a screening mechanism with excellent but not perfect ability to prev ent cervical carcinoma.  It has a low, but  significant, diagnostic error rate. The pap  test is suboptimal  for detection of glandular lesions.  It should be noted that a negative result does not definitively rule out the presence of disease.Ref: DeMay, RM, The Art and Science of Cytopathology,  [employer], [phone]. Last Menstrual Period: 07/29/2016  Other Clinical Conditions: Previous Abnormal Pap - per requisition: REMOTE H/O ABNORMAL PAP SMEARS Technical processing performed at [facility], [address] [place], [place] [address], CLIA# [id number], unless otherwise indicated.   Comprehensive metabolic panel     Status: None   Collection Time: 08/18/16 11:25 AM  Result Value Ref Range   Sodium 139 135 - 146 mmol/L   Potassium 4.4 3.5 - 5.3 mmol/L   Chloride 105 98 - 110 mmol/L   CO2 24 20 - 31 mmol/L   Glucose, Bld 90 65 - 99 mg/dL   BUN 9 7 - 25 mg/dL   Creat 0.87 0.50 - 1.10 mg/dL   Total Bilirubin 0.6 0.2 - 1.2 mg/dL   Alkaline Phosphatase 36 33 - 115 U/L   AST 16 10 - 35 U/L   ALT 11 6 - 29 U/L   Total Protein 7.2 6.1 - 8.1 g/dL   Albumin 4.6 3.6 - 5.1 g/dL   Calcium 9.4 8.6 - 10.2 mg/dL  Lipid panel     Status: None   Collection Time: 08/18/16 11:25 AM  Result Value Ref Range   Cholesterol 190 <200 mg/dL   Triglycerides 61 <150 mg/dL   HDL 96 >50 mg/dL   Total CHOL/HDL Ratio 2.0 <5.0 Ratio   VLDL 12 <30 mg/dL   LDL Cholesterol 82 <100 mg/dL  TSH     Status: None   Collection Time: 08/18/16 11:25 AM  Result Value Ref Range   TSH 1.42 mIU/L    Comment:   Reference Range   > or = 20 Years  0.40-4.50   Pregnancy Range First trimester  0.26-2.66 Second trimester 0.55-2.73 Third trimester  0.43-2.91     VITAMIN D 25 Hydroxy (Vit-D Deficiency, Fractures)     Status: None   Collection Time: 08/18/16 11:25 AM  Result Value Ref Range   Vit D, 25-Hydroxy 40 30 - 100 ng/mL    Comment: Vitamin D Status           25-OH Vitamin D        Deficiency                <20 ng/mL        Insufficiency         20 - 29 ng/mL        Optimal             > or  = 30 ng/mL   For 25-OH Vitamin D testing on patients on D2-supplementation and patients for whom quantitation of D2 and D3 fractions is required, the QuestAssureD 25-OH VIT D, (D2,D3), LC/MS/MS is recommended: order code [id number] (patients > 2 yrs).   Follicle stimulating hormone     Status: None   Collection Time: 08/18/16 11:25 AM  Result Value Ref Range   FSH 11.0 mIU/mL    Comment:   Reference Range Female >=68 years of age: Follicular Phase 3.5-57.3 Mid-Cycle Peak   3.1-17.7 Luteal Phase     1.5-9.1 Postmenopausal   23.0-116.3     CBC with Differential/Platelet     Status: None   Collection Time: 08/18/16 11:25 AM  Result Value Ref Range   WBC 4.1 3.8 - 10.8 K/uL   RBC 4.11 3.80 - 5.10 MIL/uL   Hemoglobin 12.4 11.7 - 15.5 g/dL   HCT 38.3 35.0 - 45.0 %   MCV 93.2 80.0 - 100.0 fL   MCH 30.2 27.0 - 33.0 pg   MCHC 32.4 32.0 - 36.0 g/dL   RDW 13.2 11.0 - 15.0 %   Platelets 227 140 - 400 K/uL   MPV 10.6 7.5 - 12.5 fL   Neutro Abs 2,788 1,500 - 7,800 cells/uL   Lymphs Abs 943 850 - 3,900 cells/uL   Monocytes Absolute 246 200 - 950 cells/uL  Eosinophils Absolute 82 15 - 500 cells/uL   Basophils Absolute 41 0 - 200 cells/uL   Neutrophils Relative % 68 %   Lymphocytes Relative 23 %   Monocytes Relative 6 %   Eosinophils Relative 2 %   Basophils Relative 1 %   Smear Review Criteria for review not met     Assessment/Plan: 1. Acute bacterial sinusitis Flu swab negative. Significant sinus tenderness on percussion. Start ABX -- Augmentin. Tessalon per orders. Supportive measures and OTC medications reviewed. F/U if not improving.   - amoxicillin-clavulanate (AUGMENTIN) 875-125 MG tablet; Take 1 tablet by mouth 2 (two) times daily.  Dispense: 14 tablet; Refill: 0 - benzonatate (TESSALON) 100 MG capsule; Take 1 capsule (100 mg total) by mouth 2 (two) times daily as needed for cough.  Dispense: 20 capsule; Refill: 0   Leeanne Rio, [place]

## 2016-09-19 NOTE — Telephone Encounter (Signed)
Patient debating whether or not to schedule appointment, several complaints including Low grade fever, headaches, sinus pressure.  Wanted to speak with nurse first before making appointment.

## 2016-09-19 NOTE — Progress Notes (Signed)
Pre visit review using our clinic review tool, if applicable. No additional management support is needed unless otherwise documented below in the visit note. 

## 2016-09-19 NOTE — Patient Instructions (Signed)
Please take antibiotic as directed.  Increase fluid intake.  Use Saline nasal spray.  Take a daily multivitamin. Continue Flonase. Use Tessalon as directed for cough. Place a humidifier in the bedroom.  Please call or return clinic if symptoms are not improving.  Sinusitis Sinusitis is redness, soreness, and swelling (inflammation) of the paranasal sinuses. Paranasal sinuses are air pockets within the bones of your face (beneath the eyes, the middle of the forehead, or above the eyes). In healthy paranasal sinuses, mucus is able to drain out, and air is able to circulate through them by way of your nose. However, when your paranasal sinuses are inflamed, mucus and air can become trapped. This can allow bacteria and other germs to grow and cause infection. Sinusitis can develop quickly and last only a short time (acute) or continue over a long period (chronic). Sinusitis that lasts for more than 12 weeks is considered chronic.  CAUSES  Causes of sinusitis include:  Allergies.  Structural abnormalities, such as displacement of the cartilage that separates your nostrils (deviated septum), which can decrease the air flow through your nose and sinuses and affect sinus drainage.  Functional abnormalities, such as when the small hairs (cilia) that line your sinuses and help remove mucus do not work properly or are not present. SYMPTOMS  Symptoms of acute and chronic sinusitis are the same. The primary symptoms are pain and pressure around the affected sinuses. Other symptoms include:  Upper toothache.  Earache.  Headache.  Bad breath.  Decreased sense of smell and taste.  A cough, which worsens when you are lying flat.  Fatigue.  Fever.  Thick drainage from your nose, which often is green and may contain pus (purulent).  Swelling and warmth over the affected sinuses. DIAGNOSIS  Your caregiver will perform a physical exam. During the exam, your caregiver may:  Look in your nose for  signs of abnormal growths in your nostrils (nasal polyps).  Tap over the affected sinus to check for signs of infection.  View the inside of your sinuses (endoscopy) with a special imaging device with a light attached (endoscope), which is inserted into your sinuses. If your caregiver suspects that you have chronic sinusitis, one or more of the following tests may be recommended:  Allergy tests.  Nasal culture A sample of mucus is taken from your nose and sent to a lab and screened for bacteria.  Nasal cytology A sample of mucus is taken from your nose and examined by your caregiver to determine if your sinusitis is related to an allergy. TREATMENT  Most cases of acute sinusitis are related to a viral infection and will resolve on their own within 10 days. Sometimes medicines are prescribed to help relieve symptoms (pain medicine, decongestants, nasal steroid sprays, or saline sprays).  However, for sinusitis related to a bacterial infection, your caregiver will prescribe antibiotic medicines. These are medicines that will help kill the bacteria causing the infection.  Rarely, sinusitis is caused by a fungal infection. In theses cases, your caregiver will prescribe antifungal medicine. For some cases of chronic sinusitis, surgery is needed. Generally, these are cases in which sinusitis recurs more than 3 times per year, despite other treatments. HOME CARE INSTRUCTIONS   Drink plenty of water. Water helps thin the mucus so your sinuses can drain more easily.  Use a humidifier.  Inhale steam 3 to 4 times a day (for example, sit in the bathroom with the shower running).  Apply a warm, moist washcloth to your  face 3 to 4 times a day, or as directed by your caregiver.  Use saline nasal sprays to help moisten and clean your sinuses.  Take over-the-counter or prescription medicines for pain, discomfort, or fever only as directed by your caregiver. SEEK IMMEDIATE MEDICAL CARE IF:  You have  increasing pain or severe headaches.  You have nausea, vomiting, or drowsiness.  You have swelling around your face.  You have vision problems.  You have a stiff neck.  You have difficulty breathing. MAKE SURE YOU:   Understand these instructions.  Will watch your condition.  Will get help right away if you are not doing well or get worse. Document Released: 06/12/2005 Document Revised: 09/04/2011 Document Reviewed: 06/27/2011 [facility] Patient Information 2014 [place], [place].

## 2017-07-24 ENCOUNTER — Other Ambulatory Visit: Payer: Self-pay | Admitting: Obstetrics & Gynecology

## 2017-07-24 ENCOUNTER — Telehealth: Payer: Self-pay | Admitting: Obstetrics & Gynecology

## 2017-07-24 MED ORDER — ALPRAZOLAM 0.5 MG PO TABS: 0.5000 mg | ORAL_TABLET | Freq: Every evening | ORAL | 0 refills | Status: DC | PRN

## 2017-07-24 NOTE — Progress Notes (Signed)
Xanax 0.5mg  qhs prn sleep.  #2/0RF sent to pharmacy on file per pt phone request.

## 2017-07-24 NOTE — Telephone Encounter (Signed)
Rx has been done and sent to pharmacy on file.

## 2017-07-24 NOTE — Telephone Encounter (Signed)
Patient called and said, "I'd like to request a prescription for two Xanax pills. I'm traveling and have a quick turn-around that's causing me to worry about sleep."  Pharmacy on file confirmed.

## 2017-07-24 NOTE — Telephone Encounter (Signed)
Routing to [facility] for review and advise of medication request.

## 2017-07-25 NOTE — Telephone Encounter (Signed)
Spoke with patient. Advised rx has been sent to pharmacy by [place]. Patient verbalizes understanding. Encounter closed.

## 2017-11-05 ENCOUNTER — Encounter: Payer: Self-pay | Admitting: Obstetrics and Gynecology

## 2017-11-05 ENCOUNTER — Other Ambulatory Visit: Payer: Self-pay

## 2017-11-05 ENCOUNTER — Ambulatory Visit: Payer: 59 | Admitting: Obstetrics and Gynecology

## 2017-11-05 VITALS — BP 102/70 | HR 74 | Resp 14 | Ht 64.0 in | Wt 116.0 lb

## 2017-11-05 DIAGNOSIS — E559 Vitamin D deficiency, unspecified: Secondary | ICD-10-CM | POA: Diagnosis not present

## 2017-11-05 DIAGNOSIS — Z1211 Encounter for screening for malignant neoplasm of colon: Secondary | ICD-10-CM | POA: Diagnosis not present

## 2017-11-05 DIAGNOSIS — Z30431 Encounter for routine checking of intrauterine contraceptive device: Secondary | ICD-10-CM

## 2017-11-05 DIAGNOSIS — Z01419 Encounter for gynecological examination (general) (routine) without abnormal findings: Secondary | ICD-10-CM

## 2017-11-05 DIAGNOSIS — Z0000 Encounter for general adult medical examination without abnormal findings: Secondary | ICD-10-CM | POA: Diagnosis not present

## 2017-11-05 NOTE — Patient Instructions (Signed)

## 2017-11-05 NOTE — Progress Notes (Signed)
47 y.o. [id number] MarriedCaucasianF here for annual exam.  She has a paragard IUD, placed in 6/16.  Cycles are every 23-25 days x 4-6 days. Can saturate a tampon in 2 hours. Tolerable cramps. No dyspareunia.  Period Cycle (Days): 25 Period Duration (Days): 4-5 days  Period Pattern: (!) Irregular Menstrual Flow: Moderate Menstrual Control: Tampon Menstrual Control Change Freq (Hours): changes tampon every 2-3 hours  Dysmenorrhea: None  Patient's last menstrual period was 10/28/2017.          Sexually active: Yes.    The current method of family planning is IUD.    Exercising: Yes.    hot yoga/ cycing / cardio Smoker:  Former smoker  Health Maintenance: Pap:  08-18-16 WNL 12-04-14 WNl NEG HR HPV  History of abnormal Pap:  Yes- HX Colposcopy and LEEP in her 20's  MMG:  07-21-16 WNL  Colonoscopy:  Never BMD:   never TDaP:  08-08-11 Gardasil: N/A   reports that she quit smoking about 13 years ago. Her smoking use included cigarettes. She has a 15.00 pack-year smoking history. She has never used smokeless tobacco. She reports that she drinks about 4.8 oz of alcohol per week. She reports that she does not use drugs. About 8 drinks a week. She works in Manufacturing engineer. Trained as an Therapist, sports.  Daughter is 20, son is 36.   Past Medical History:  Diagnosis Date  . Abnormal cervical cytology 08/08/2011  . Acne 08/08/2011  . Anemia 11/06/2011  . Chicken pox 25  . History of HPV infection   . Hypertension in pregnancy, preeclampsia/eclampsia/prior HTN/postp cond   . Mole (skin) 08/08/2011  . Neutropenia 11/06/2011   --saw hematologist at [place] in [facility]    Past Surgical History:  Procedure Laterality Date  . ABDOMINOPLASTY  09-24-13   Dr Stephanie Coup  . CERVICAL BIOPSY  W/ LOOP ELECTRODE EXCISION  Age 62  . GYNECOLOGIC CRYOSURGERY  18  . [place]    . SKIN BIOPSY     right lower abdominal wall, benign  . TONSILLECTOMY  47 yrs old    Current Outpatient Medications  Medication Sig Dispense  Refill  . ALPRAZolam (XANAX) 0.5 MG tablet Take 1 tablet (0.5 mg total) by mouth at bedtime as needed for sleep. 2 tablet 0  . Biotin 10000 MCG TABS Take by mouth.    Marland Kitchen ibuprofen (ADVIL,MOTRIN) 600 MG tablet Take 600 mg by mouth every 6 (six) hours as needed.    . IUD's (PARAGARD INTRAUTERINE COPPER) IUD IUD In place since birth of 77 year old son 1 each 0  . Nutritional Supplements (JUICE PLUS FIBRE PO) Take by mouth daily.     No current facility-administered medications for this visit.     Family History  Problem Relation Age of Onset  . Hypertension Mother   . Transient ischemic attack Mother   . Cancer Mother        uterine, tah at 79  . Hyperlipidemia Mother   . Mental illness Mother   . Cancer Father        lung  . Other Father        coronary artery disease  . Heart disease Father 74       bypass, triple  . Diabetes Father   . Hyperlipidemia Father   . Hypertension Father   . Thyroid disease Father   . Mental illness Father   . Obesity Sister   . Mental illness Sister  bipolar  . Drug abuse Sister   . Macular degeneration Maternal Grandmother   . Blindness Maternal Grandmother   . Alzheimer's disease Maternal Grandmother   . Cancer Maternal Grandmother        ovarian cancer  . Dementia Maternal Grandmother        late onset  . Other Maternal Grandfather        stent  . Heart disease Maternal Grandfather        stents, MI, pacer for sinus brady  . Arthritis Maternal Grandfather   . Aneurysm Paternal Grandfather        brain    Review of Systems  Constitutional: Negative.   HENT: Negative.   Eyes: Negative.   Respiratory: Negative.   Cardiovascular: Negative.   Gastrointestinal: Negative.   Endocrine: Negative.   Genitourinary: Negative.   Musculoskeletal: Negative.   Skin: Negative.   Allergic/Immunologic: Negative.   Neurological: Negative.   Psychiatric/Behavioral: Negative.     Exam:   BP 102/70 (BP Location: Right Arm, Patient  Position: Sitting, Cuff Size: Normal)   Pulse 74   Resp 14   Ht 5\' 4"  (1.626 m)   Wt 116 lb (52.6 kg)   LMP 10/28/2017   BMI 19.91 kg/m   Weight change: @WEIGHTCHANGE @ Height:   Height: 5\' 4"  (162.6 cm)  Ht Readings from Last 3 Encounters:  11/05/17 5\' 4"  (1.626 m)  09/19/16 5\' 5"  (1.651 m)  08/18/16 5\' 5"  (1.651 m)    General appearance: alert, cooperative and appears stated age Head: Normocephalic, without obvious abnormality, atraumatic Neck: no adenopathy, supple, symmetrical, trachea midline and thyroid normal to inspection and palpation Lungs: clear to auscultation bilaterally Cardiovascular: regular rate and rhythm Breasts: normal appearance, no masses or tenderness Abdomen: soft, non-tender; non distended,  no masses,  no organomegaly Extremities: extremities normal, atraumatic, no cyanosis or edema Skin: Skin color, texture, turgor normal. No rashes or lesions Lymph nodes: Cervical, supraclavicular, and axillary nodes normal. No abnormal inguinal nodes palpated Neurologic: Grossly normal   Pelvic: External genitalia:  no lesions              Urethra:  normal appearing urethra with no masses, tenderness or lesions              Bartholins and Skenes: normal                 Vagina: normal appearing vagina with normal color and discharge, no lesions              Cervix: no lesions, IUD string 3 cm               Bimanual Exam:  Uterus:  normal size, contour, position, consistency, mobility, non-tender              Adnexa: no mass, fullness, tenderness               Rectovaginal: Confirms               Anus:  normal sphincter tone, no lesions  Chaperone was present for exam.  A:  Well Woman with normal exam  IUD check  H/O vit d def  H/O neutropenia, negative evaluation (normal last year)  P:   No pap this year  Screening labs  Vit d  Discussed breast self exam  Discussed calcium and vit D intake

## 2017-11-06 LAB — CBC WITH DIFFERENTIAL/PLATELET
BASOS: 1 %
Basophils Absolute: 0 10*3/uL (ref 0.0–0.2)
EOS (ABSOLUTE): 0.1 10*3/uL (ref 0.0–0.4)
EOS: 2 %
HEMATOCRIT: 34.7 % (ref 34.0–46.6)
Hemoglobin: 11.8 g/dL (ref 11.1–15.9)
Immature Grans (Abs): 0 10*3/uL (ref 0.0–0.1)
Immature Granulocytes: 0 %
LYMPHS ABS: 1.2 10*3/uL (ref 0.7–3.1)
Lymphs: 36 %
MCH: 31.1 pg (ref 26.6–33.0)
MCHC: 34 g/dL (ref 31.5–35.7)
MCV: 92 fL (ref 79–97)
MONOS ABS: 0.2 10*3/uL (ref 0.1–0.9)
Monocytes: 5 %
NEUTROS PCT: 56 %
Neutrophils Absolute: 1.9 10*3/uL (ref 1.4–7.0)
PLATELETS: 257 10*3/uL (ref 150–379)
RBC: 3.79 x10E6/uL (ref 3.77–5.28)
RDW: 15 % (ref 12.3–15.4)
WBC: 3.3 10*3/uL — AB (ref 3.4–10.8)

## 2017-11-06 LAB — LIPID PANEL
Chol/HDL Ratio: 2 ratio (ref 0.0–4.4)
Cholesterol, Total: 181 mg/dL (ref 100–199)
HDL: 89 mg/dL (ref >39)
LDL CALC: 77 mg/dL (ref 0–99)
Triglycerides: 73 mg/dL (ref 0–149)
VLDL CHOLESTEROL CAL: 15 mg/dL (ref 5–40)

## 2017-11-06 LAB — COMPREHENSIVE METABOLIC PANEL
A/G RATIO: 1.9 (ref 1.2–2.2)
ALT: 13 IU/L (ref 0–32)
AST: 22 IU/L (ref 0–40)
Albumin: 4.4 g/dL (ref 3.5–5.5)
Alkaline Phosphatase: 33 IU/L — ABNORMAL LOW (ref 39–117)
BUN / CREAT RATIO: 15 (ref 9–23)
BUN: 11 mg/dL (ref 6–24)
Bilirubin Total: 0.6 mg/dL (ref 0.0–1.2)
CO2: 21 mmol/L (ref 20–29)
CREATININE: 0.74 mg/dL (ref 0.57–1.00)
Calcium: 9 mg/dL (ref 8.7–10.2)
Chloride: 103 mmol/L (ref 96–106)
GFR, EST AFRICAN AMERICAN: 112 mL/min/{1.73_m2} (ref >59)
GFR, EST NON AFRICAN AMERICAN: 97 mL/min/{1.73_m2} (ref >59)
GLOBULIN, TOTAL: 2.3 g/dL (ref 1.5–4.5)
Glucose: 84 mg/dL (ref 65–99)
Potassium: 4.3 mmol/L (ref 3.5–5.2)
Sodium: 142 mmol/L (ref 134–144)
Total Protein: 6.7 g/dL (ref 6.0–8.5)

## 2017-11-06 LAB — VITAMIN D 25 HYDROXY (VIT D DEFICIENCY, FRACTURES): VIT D 25 HYDROXY: 54.1 ng/mL (ref 30.0–100.0)

## 2018-01-15 ENCOUNTER — Other Ambulatory Visit: Payer: Self-pay

## 2018-01-15 ENCOUNTER — Telehealth: Payer: Self-pay | Admitting: Obstetrics and Gynecology

## 2018-01-15 ENCOUNTER — Encounter: Payer: Self-pay | Admitting: Obstetrics and Gynecology

## 2018-01-15 ENCOUNTER — Ambulatory Visit: Payer: 59 | Admitting: Obstetrics and Gynecology

## 2018-01-15 VITALS — BP 102/62 | HR 90 | Resp 14 | Ht 64.0 in | Wt 113.0 lb

## 2018-01-15 DIAGNOSIS — N926 Irregular menstruation, unspecified: Secondary | ICD-10-CM

## 2018-01-15 DIAGNOSIS — Z30432 Encounter for removal of intrauterine contraceptive device: Secondary | ICD-10-CM | POA: Diagnosis not present

## 2018-01-15 DIAGNOSIS — Z3009 Encounter for other general counseling and advice on contraception: Secondary | ICD-10-CM

## 2018-01-15 DIAGNOSIS — N943 Premenstrual tension syndrome: Secondary | ICD-10-CM

## 2018-01-15 DIAGNOSIS — N644 Mastodynia: Secondary | ICD-10-CM

## 2018-01-15 LAB — POCT URINE PREGNANCY: Preg Test, Ur: NEGATIVE

## 2018-01-15 MED ORDER — ETONOGESTREL-ETHINYL ESTRADIOL 0.12-0.015 MG/24HR VA RING: VAGINAL_RING | VAGINAL | 0 refills | Status: DC

## 2018-01-15 NOTE — Progress Notes (Signed)
GYNECOLOGY  VISIT   HPI: 47 y.o.   Married  Caucasian  female   G2P2002 with Patient's last menstrual period was 12/19/2017.   here for   Irregular menses The patient has a paragard IUD, placed in 6/16 Over the last 6-12 months her cycles have progressively gotten worse. 1/2 the month she feels great, 1/2 the month she has terrible PMS and/or a terrible cycles.  Cycles range from 20-32 days. Bleeding for 5-6 days. Saturates a super tampon in up to 1.5 hours. Cramps are bad, now starting days prior and lasting 8 days total. PMS starts with ovulation, ends a few days into her cycle. Breast are very tender prior to her cycles.  She is having some night sweats prior to her cycle. Overall tolerable.  She also has nausea prior to her cycle and constipation, sometimes loose. GI symptoms last 4-5 days for the last few months prior to her cycles.   GYNECOLOGIC HISTORY: Patient's last menstrual period was 12/19/2017. Contraception: IUD  Menopausal hormone therapy: none         OB History    Gravida  2   Para  2   Term  2   Preterm      AB      Living  2     SAB      TAB      Ectopic      Multiple      Live Births  2              Patient Active Problem List   Diagnosis Date Noted  . Blue nail beds 08/13/2014  . SOB (shortness of breath) on exertion 08/13/2014  . Dysuria 08/13/2014  . BMI less than 19,adult 08/13/2014  . Neutropenia ([place]) 11/06/2011  . Anemia 11/06/2011  . Abnormal cervical cytology 08/08/2011  . Preventative health care 08/08/2011    Past Medical History:  Diagnosis Date  . Abnormal cervical cytology 08/08/2011  . Acne 08/08/2011  . Anemia 11/06/2011  . Chicken pox 25  . History of HPV infection   . Hypertension in pregnancy, preeclampsia/eclampsia/prior HTN/postp cond   . Mole (skin) 08/08/2011  . Neutropenia 11/06/2011   --saw hematologist at [facility] in [facility]    Past Surgical History:  Procedure Laterality Date  . ABDOMINOPLASTY   09-24-13   Dr Stephanie Coup  . CERVICAL BIOPSY  W/ LOOP ELECTRODE EXCISION  Age 52  . GYNECOLOGIC CRYOSURGERY  18  . leap    . SKIN BIOPSY     right lower abdominal wall, benign  . TONSILLECTOMY  47 yrs old    Current Outpatient Medications  Medication Sig Dispense Refill  . ALPRAZolam (XANAX) 0.5 MG tablet Take 1 tablet (0.5 mg total) by mouth at bedtime as needed for sleep. 2 tablet 0  . Biotin 10000 MCG TABS Take by mouth.    Marland Kitchen ibuprofen (ADVIL,MOTRIN) 600 MG tablet Take 600 mg by mouth every 6 (six) hours as needed.    . IRON PO Take by mouth. Plant based- blood Museum/gallery curator    . IUD's (PARAGARD INTRAUTERINE COPPER) IUD IUD In place since birth of 31 year old son 1 each 0  . Nutritional Supplements (JUICE PLUS FIBRE PO) Take by mouth daily.     No current facility-administered medications for this visit.      ALLERGIES: Patient has no known allergies.  Family History  Problem Relation Age of Onset  . Hypertension Mother   . Transient ischemic attack  Mother   . Cancer Mother        uterine, tah at 10  . Hyperlipidemia Mother   . Mental illness Mother   . Cancer Father        lung  . Other Father        coronary artery disease  . Heart disease Father 47       bypass, triple  . Diabetes Father   . Hyperlipidemia Father   . Hypertension Father   . Thyroid disease Father   . Mental illness Father   . Obesity Sister   . Mental illness Sister        bipolar  . Drug abuse Sister   . Macular degeneration Maternal Grandmother   . Blindness Maternal Grandmother   . Alzheimer's disease Maternal Grandmother   . Cancer Maternal Grandmother        ovarian cancer  . Dementia Maternal Grandmother        late onset  . Other Maternal Grandfather        stent  . Heart disease Maternal Grandfather        stents, MI, pacer for sinus brady  . Arthritis Maternal Grandfather   . Aneurysm Paternal Grandfather        brain    Social History   Socioeconomic History  . Marital status:  Married    Spouse name: [name]  . Number of children: 2  . Years of education: BSN  . Highest education level: Not on file  Occupational History  . Occupation: Manufacturing engineer  Social Needs  . Financial resource strain: Not on file  . Food insecurity:    Worry: Not on file    Inability: Not on file  . Transportation needs:    Medical: Not on file    Non-medical: Not on file  Tobacco Use  . Smoking status: Former Smoker    Packs/day: 1.00    Years: 15.00    Pack years: 15.00    Types: Cigarettes    Last attempt to quit: 06/26/2004    Years since quitting: 13.5  . Smokeless tobacco: Never Used  Substance and Sexual Activity  . Alcohol use: Yes    Alcohol/week: 4.8 oz    Types: 8 Standard drinks or equivalent per week  . Drug use: No  . Sexual activity: Yes    Partners: Male    Birth control/protection: IUD    Comment: Paragard  Lifestyle  . Physical activity:    Days per week: Not on file    Minutes per session: Not on file  . Stress: Not on file  Relationships  . Social connections:    Talks on phone: Not on file    Gets together: Not on file    Attends religious service: Not on file    Active member of club or organization: Not on file    Attends meetings of clubs or organizations: Not on file    Relationship status: Not on file  . Intimate partner violence:    Fear of current or ex partner: Not on file    Emotionally abused: Not on file    Physically abused: Not on file    Forced sexual activity: Not on file  Other Topics Concern  . Not on file  Social History Narrative   Married to [place]. 2 children Gwyndolyn Saxon and [place].    BSN- Programmer, applications.    Drinks caffeine.    Herbal remedies, daily vitamin.   Wears her  seatbelt. Smoke detector in the home.    Exercises routinely.    Diet: no beef or pork.    Feels safe in her relationships.     Review of Systems  Constitutional: Negative.           HENT: Negative.   Eyes: Negative.   Respiratory:  Negative.   Cardiovascular: Negative.   Gastrointestinal: Positive for abdominal pain, constipation and nausea.       Bloating   Genitourinary:       Vaginal bleeding Dysmenorrhea  Breast tenderness     Musculoskeletal: Negative.   Skin: Negative.   Neurological:       Insomnia   Endo/Heme/Allergies:       Night sweats  Psychiatric/Behavioral: Negative.     PHYSICAL EXAMINATION:    BP 102/62 (BP Location: Right Arm, Patient Position: Sitting, Cuff Size: Normal)   Pulse 90   Resp 14   Ht 5\' 4"  (1.626 m)   Wt 113 lb (51.3 kg)   LMP 12/19/2017   BMI 19.40 kg/m     General appearance: alert, cooperative and appears stated age  Pelvic: External genitalia:  no lesions              Urethra:  normal appearing urethra with no masses, tenderness or lesions              Bartholins and Skenes: normal                 Vagina: normal appearing vagina with normal color and discharge, no lesions              Cervix: no lesions, IUD string 3 cm. IUD removed with ringed forceps.  Chaperone was present for exam.  ASSESSMENT Recent cycle changes, PMS, cyclic breast tenderness and cycle related GI symptoms Heavy cycles with worsening cramps, has a paragard    PLAN Paragard removed Start the nuvaring, no contraindications, risks reviewed F/U in 3 months, call with any concerns   An After Visit Summary was printed and given to the patient.  ~15 minutes face to face time of which over 50% was spent in counseling.

## 2018-01-15 NOTE — Telephone Encounter (Signed)
Patient called requesting an appointment for increased heavy painful menstrual cycles that are also lasting longer.  Last seen: 11/05/17

## 2018-01-15 NOTE — Patient Instructions (Signed)
Ethinyl Estradiol; Etonogestrel vaginal ring What is this medicine? ETHINYL ESTRADIOL; ETONOGESTREL (ETH in il es tra DYE ole; et oh noe JES trel) vaginal ring is a flexible, vaginal ring used as a contraceptive (birth control method). This medicine combines two types of female hormones, an estrogen and a progestin. This ring is used to prevent ovulation and pregnancy. Each ring is effective for one month. This medicine may be used for other purposes; ask your health care provider or pharmacist if you have questions. COMMON BRAND NAME(S): NuvaRing What should I tell my health care provider before I take this medicine? They need to know if you have or ever had any of these conditions: -abnormal vaginal bleeding -blood vessel disease or blood clots -breast, cervical, endometrial, ovarian, liver, or uterine cancer -diabetes -gallbladder disease -heart disease or recent heart attack -high blood pressure -high cholesterol -kidney disease -liver disease -migraine headaches -stroke -systemic lupus erythematosus (SLE) -tobacco smoker -an unusual or allergic reaction to estrogens, progestins, other medicines, foods, dyes, or preservatives -pregnant or trying to get pregnant -breast-feeding How should I use this medicine? Insert the ring into your vagina as directed. Follow the directions on the prescription label. The ring will remain place for 3 weeks and is then removed for a 1-week break. A new ring is inserted 1 week after the last ring was removed, on the same day of the week. Check often to make sure the ring is still in place, especially before and after sexual intercourse. If the ring was out of the vagina for an unknown amount of time, you may not be protected from pregnancy. Perform a pregnancy test and call your doctor. Do not use more often than directed. A patient package insert for the product will be given with each prescription and refill. Read this sheet carefully each time. The  sheet may change frequently. Contact your pediatrician regarding the use of this medicine in children. Special care may be needed. This medicine has been used in female children who have started having menstrual periods. Overdosage: If you think you have taken too much of this medicine contact a poison control center or emergency room at once. NOTE: This medicine is only for you. Do not share this medicine with others. What if I miss a dose? You will need to replace your vaginal ring once a month as directed. If the ring should slip out, or if you leave it in longer or shorter than you should, contact your health care professional for advice. What may interact with this medicine? Do not take this medicine with the following medication: -dasabuvir; ombitasvir; paritaprevir; ritonavir -ombitasvir; paritaprevir; ritonavir This medicine may also interact with the following medications: -acetaminophen -antibiotics or medicines for infections, especially rifampin, rifabutin, rifapentine, and griseofulvin, and possibly penicillins or tetracyclines -aprepitant -ascorbic acid (vitamin C) -atorvastatin -barbiturate medicines, such as phenobarbital -bosentan -carbamazepine -caffeine -clofibrate -cyclosporine -dantrolene -doxercalciferol -felbamate -grapefruit juice -hydrocortisone -medicines for anxiety or sleeping problems, such as diazepam or temazepam -medicines for diabetes, including pioglitazone -modafinil -mycophenolate -nefazodone -oxcarbazepine -phenytoin -prednisolone -ritonavir or other medicines for HIV infection or AIDS -rosuvastatin -selegiline -soy isoflavones supplements -St. John's wort -tamoxifen or raloxifene -theophylline -thyroid hormones -topiramate -warfarin This list may not describe all possible interactions. Give your health care provider a list of all the medicines, herbs, non-prescription drugs, or dietary supplements you use. Also tell them if you smoke,  drink alcohol, or use illegal drugs. Some items may interact with your medicine. What should I watch for while using   this medicine? Visit your doctor or health care professional for regular checks on your progress. You will need a regular breast and pelvic exam and Pap smear while on this medicine. Use an additional method of contraception during the first cycle that you use this ring. Do not use a diaphragm or female condom, as the ring can interfere with these birth control methods and their proper placement. If you have any reason to think you are pregnant, stop using this medicine right away and contact your doctor or health care professional. If you are using this medicine for hormone related problems, it may take several cycles of use to see improvement in your condition. Smoking increases the risk of getting a blood clot or having a stroke while you are using hormonal birth control, especially if you are more than 47 years old. You are strongly advised not to smoke. This medicine can make your body retain fluid, making your fingers, hands, or ankles swell. Your blood pressure can go up. Contact your doctor or health care professional if you feel you are retaining fluid. This medicine can make you more sensitive to the sun. Keep out of the sun. If you cannot avoid being in the sun, wear protective clothing and use sunscreen. Do not use sun lamps or tanning beds/booths. If you wear contact lenses and notice visual changes, or if the lenses begin to feel uncomfortable, consult your eye care specialist. In some women, tenderness, swelling, or minor bleeding of the gums may occur. Notify your dentist if this happens. Brushing and flossing your teeth regularly may help limit this. See your dentist regularly and inform your dentist of the medicines you are taking. If you are going to have elective surgery, you may need to stop using this medicine before the surgery. Consult your health care professional  for advice. This medicine does not protect you against HIV infection (AIDS) or any other sexually transmitted diseases. What side effects may I notice from receiving this medicine? Side effects that you should report to your doctor or health care professional as soon as possible: -breast tissue changes or discharge -changes in vaginal bleeding during your period or between your periods -chest pain -coughing up blood -dizziness or fainting spells -headaches or migraines -leg, arm or groin pain -severe or sudden headaches -stomach pain (severe) -sudden shortness of breath -sudden loss of coordination, especially on one side of the body -speech problems -symptoms of vaginal infection like itching, irritation or unusual discharge -tenderness in the upper abdomen -vomiting -weakness or numbness in the arms or legs, especially on one side of the body -yellowing of the eyes or skin Side effects that usually do not require medical attention (report to your doctor or health care professional if they continue or are bothersome): -breakthrough bleeding and spotting that continues beyond the 3 initial cycles of pills -breast tenderness -mood changes, anxiety, depression, frustration, anger, or emotional outbursts -increased sensitivity to sun or ultraviolet light -nausea -skin rash, acne, or brown spots on the skin -weight gain (slight) This list may not describe all possible side effects. Call your doctor for medical advice about side effects. You may report side effects to FDA at 1-800-FDA-1088. Where should I keep my medicine? Keep out of the reach of children. Store at room temperature between 15 and 30 degrees C (59 and 86 degrees F) for up to 4 months. The product will expire after 4 months. Protect from light. Throw away any unused medicine after the expiration date. NOTE: This   sheet is a summary. It may not cover all possible information. If you have questions about this medicine, talk  to your doctor, pharmacist, or health care provider.  2018 Elsevier/Gold Standard (2016-02-18 17:00:31)  

## 2018-01-15 NOTE — Telephone Encounter (Signed)
Spoke with patient. LMP 12/22/17. Reports symptoms of pain, nausea and breast tenderness prior to menses. Menses have become heavier, longer and more irregular over the last 6 mo. Cycles vary from 20-32 days.  Insomnia and night sweats. Dizziness when on menses. Has paragard IUD. Has discussed in the past option of Mirena to help with symptoms, may want to visit this option again.   OV scheduled for today at 3pm with Dr. Talbert Nan.   Routing to provider for final review. Patient is agreeable to disposition. Will close encounter.

## 2018-02-20 ENCOUNTER — Other Ambulatory Visit: Payer: Self-pay | Admitting: Obstetrics and Gynecology

## 2018-02-20 LAB — FECAL OCCULT BLOOD, IMMUNOCHEMICAL: FECAL OCCULT BLD: NEGATIVE

## 2018-02-28 ENCOUNTER — Other Ambulatory Visit: Payer: Self-pay | Admitting: *Deleted

## 2018-02-28 NOTE — Telephone Encounter (Signed)
Detailed message left per DPR on mobile number. Advising patient refill request received from pharmacy for nuvaring. Advised patient needs to return call to schedule 3 month follow up per OV note on 01-15-18. RF refused from pharmacy as 3 month supply sent 01-15-18 and patient needs 3 month follow up appointment for additional refills.

## 2018-03-01 ENCOUNTER — Other Ambulatory Visit: Payer: Self-pay | Admitting: Obstetrics and Gynecology

## 2018-03-01 DIAGNOSIS — Z1231 Encounter for screening mammogram for malignant neoplasm of breast: Secondary | ICD-10-CM

## 2018-03-05 MED ORDER — ETONOGESTREL-ETHINYL ESTRADIOL 0.12-0.015 MG/24HR VA RING: VAGINAL_RING | VAGINAL | 0 refills | Status: DC

## 2018-03-05 NOTE — Telephone Encounter (Signed)
Patient returned call. Patient states she is using nuvaring continuously and is still having some old brown discharge. Patient requests to schedule for beginning of December "to give this a little more time to go away." Patient states she feels her PMS symptoms have improved. Follow up scheduled for 05-27-18 at 1330. Patient agreeable to date and time of appointment. Pharmacy on file confirmed. Advised if any additional recommendations would return call. Patient agreeable.   Medication pended for #3, 0RF.

## 2018-04-03 ENCOUNTER — Ambulatory Visit
Admission: RE | Admit: 2018-04-03 | Discharge: 2018-04-03 | Disposition: A | Discharge status: Home / Self Care | Payer: 59 | Source: Ambulatory Visit | Attending: Obstetrics and Gynecology | Admitting: Obstetrics and Gynecology

## 2018-04-03 DIAGNOSIS — Z1231 Encounter for screening mammogram for malignant neoplasm of breast: Secondary | ICD-10-CM

## 2018-05-27 ENCOUNTER — Ambulatory Visit: Payer: PRIVATE HEALTH INSURANCE | Admitting: Obstetrics and Gynecology

## 2018-05-27 ENCOUNTER — Encounter: Payer: Self-pay | Admitting: Obstetrics and Gynecology

## 2018-05-27 ENCOUNTER — Other Ambulatory Visit: Payer: Self-pay

## 2018-05-27 VITALS — BP 108/70 | HR 64 | Wt 115.0 lb

## 2018-05-27 DIAGNOSIS — Z3044 Encounter for surveillance of vaginal ring hormonal contraceptive device: Secondary | ICD-10-CM | POA: Diagnosis not present

## 2018-05-27 MED ORDER — ETONOGESTREL-ETHINYL ESTRADIOL 0.12-0.015 MG/24HR VA RING: VAGINAL_RING | VAGINAL | 1 refills | Status: DC

## 2018-05-27 NOTE — Progress Notes (Signed)
GYNECOLOGY  VISIT   HPI: 47 y.o.   Married White or Caucasian Not Hispanic or Latino  female   [phone] with No LMP recorded. (Menstrual status: Other).   here for Nuvaring follow up.   Paragard was removed and nuvaring started in 7/19. At that time she was c/o PMS, cyclic breast tenderness and cycle related GI symptoms as well have heavy cycles with cramps. She spotted intermittently for the first 3 months, she is using the ring for 5 weeks at a time and changing it. No PMS, breast tenderness, no bleeding for ~45 days. No side effects. GI symptoms are better.  Initially she had a little headache, resolved.   GYNECOLOGIC HISTORY: No LMP recorded. (Menstrual status: Other). using Nuvaring continuously Contraception:Nuvaring Menopausal hormone therapy: None        OB History    Gravida  2   Para  2   Term  2   Preterm      AB      Living  2     SAB      TAB      Ectopic      Multiple      Live Births  2              Patient Active Problem List   Diagnosis Date Noted  . Blue nail beds 08/13/2014  . SOB (shortness of breath) on exertion 08/13/2014  . Dysuria 08/13/2014  . BMI less than 19,adult 08/13/2014  . Neutropenia ([place]) 11/06/2011  . Anemia 11/06/2011  . Abnormal cervical cytology 08/08/2011  . Preventative health care 08/08/2011    Past Medical History:  Diagnosis Date  . Abnormal cervical cytology 08/08/2011  . Acne 08/08/2011  . Anemia 11/06/2011  . Chicken pox 25  . History of HPV infection   . Hypertension in pregnancy, preeclampsia/eclampsia/prior HTN/postp cond   . Mole (skin) 08/08/2011  . Neutropenia 11/06/2011   --saw hematologist at [facility] in [facility]    Past Surgical History:  Procedure Laterality Date  . ABDOMINOPLASTY  09-24-13   Dr Stephanie Coup  . CERVICAL BIOPSY  W/ LOOP ELECTRODE EXCISION  Age 74  . GYNECOLOGIC CRYOSURGERY  18  . leap    . SKIN BIOPSY     right lower abdominal wall, benign  . TONSILLECTOMY  47 yrs old     Current Outpatient Medications  Medication Sig Dispense Refill  . ALPRAZolam (XANAX) 0.5 MG tablet Take 1 tablet (0.5 mg total) by mouth at bedtime as needed for sleep. 2 tablet 0  . amoxicillin-clavulanate (AUGMENTIN) 875-125 MG tablet Take 1 tablet by mouth 2 (two) times daily.     . Biotin 10000 MCG TABS Take by mouth.    . etonogestrel-ethinyl estradiol (NUVARING) 0.12-0.015 MG/24HR vaginal ring Insert vaginally and leave in place for 4 consecutive weeks, then remove for 4 days, then place a new ring. 3 each 0  . ibuprofen (ADVIL,MOTRIN) 600 MG tablet Take 600 mg by mouth every 6 (six) hours as needed.    . IRON PO Take by mouth. Plant based- blood Museum/gallery curator    . Nutritional Supplements (JUICE PLUS FIBRE PO) Take by mouth daily.     No current facility-administered medications for this visit.      ALLERGIES: Patient has no known allergies.  Family History  Problem Relation Age of Onset  . Hypertension Mother   . Transient ischemic attack Mother   . Cancer Mother        uterine,  [place] at 35  . Hyperlipidemia Mother   . Mental illness Mother   . Cancer Father        lung  . Other Father        coronary artery disease  . Heart disease Father 5       bypass, triple  . Diabetes Father   . Hyperlipidemia Father   . Hypertension Father   . Thyroid disease Father   . Mental illness Father   . Obesity Sister   . Mental illness Sister        bipolar  . Drug abuse Sister   . Macular degeneration Maternal Grandmother   . Blindness Maternal Grandmother   . Alzheimer's disease Maternal Grandmother   . Cancer Maternal Grandmother        ovarian cancer  . Dementia Maternal Grandmother        late onset  . Other Maternal Grandfather        stent  . Heart disease Maternal Grandfather        stents, MI, pacer for sinus brady  . Arthritis Maternal Grandfather   . Aneurysm Paternal Grandfather        brain    Social History   Socioeconomic History  . Marital status: Married     Spouse name: [name]  . Number of children: 2  . Years of education: BSN  . Highest education level: Not on file  Occupational History  . Occupation: Manufacturing engineer  Social Needs  . Financial resource strain: Not on file  . Food insecurity:    Worry: Not on file    Inability: Not on file  . Transportation needs:    Medical: Not on file    Non-medical: Not on file  Tobacco Use  . Smoking status: Former Smoker    Packs/day: 1.00    Years: 15.00    Pack years: 15.00    Types: Cigarettes    Last attempt to quit: 06/26/2004    Years since quitting: 13.9  . Smokeless tobacco: Never Used  Substance and Sexual Activity  . Alcohol use: Yes    Alcohol/week: 8.0 standard drinks    Types: 8 Standard drinks or equivalent per week  . Drug use: No  . Sexual activity: Yes    Partners: Male    Birth control/protection: Inserts    Comment: Nuvaring  Lifestyle  . Physical activity:    Days per week: Not on file    Minutes per session: Not on file  . Stress: Not on file  Relationships  . Social connections:    Talks on phone: Not on file    Gets together: Not on file    Attends religious service: Not on file    Active member of club or organization: Not on file    Attends meetings of clubs or organizations: Not on file    Relationship status: Not on file  . Intimate partner violence:    Fear of current or ex partner: Not on file    Emotionally abused: Not on file    Physically abused: Not on file    Forced sexual activity: Not on file  Other Topics Concern  . Not on file  Social History Narrative   Married to [place]. 2 children Gwyndolyn Saxon and [place].    BSN- Programmer, applications.    Drinks caffeine.    Herbal remedies, daily vitamin.   Wears her seatbelt. Smoke detector in the home.    Exercises routinely.  Diet: no beef or pork.    Feels safe in her relationships.     Review of Systems  Constitutional: Negative.   HENT: Negative.   Eyes: Negative.   Respiratory: Negative.    Cardiovascular: Negative.   Gastrointestinal: Negative.   Genitourinary: Negative.   Musculoskeletal: Negative.   Skin: Negative.   Neurological: Negative.   Endo/Heme/Allergies: Negative.   Psychiatric/Behavioral: Negative.     PHYSICAL EXAMINATION:    BP 108/70 (BP Location: Right Arm, Patient Position: Sitting, Cuff Size: Normal)   Pulse 64   Wt 115 lb (52.2 kg)   BMI 19.74 kg/m     General appearance: alert, cooperative and appears stated age  ASSESSMENT Doing well with the nuvaring PMS, breast tenderness have resolved. GI symptoms have improved    PLAN Continue the nuvaring F/U for an annual exam in 5/20   An After Visit Summary was printed and given to the patient.

## 2018-11-26 NOTE — Progress Notes (Addendum)
48 y.o. [id number] Married White or Caucasian Not Hispanic or Latino female here for annual exam.  She is using the nuvaring, doing well with it. Cycles are fine. Sexually active, no pain.  She had her last mammogram in 10/19, she had noted some slight right nipple inversion just prior to that (informed the mammogram tech). It has stayed in, no change in the last 6 months.   Period Cycle (Days): 28 Period Duration (Days): 4 days Period Pattern: Regular Menstrual Flow: Light, Moderate Menstrual Control: Tampon Menstrual Control Change Freq (Hours): changes tampon every 4 hours Dysmenorrhea: (!) Mild Dysmenorrhea Symptoms: Cramping  Patient's last menstrual period was 11/07/2018 (exact date).          Sexually active: Yes.    The current method of family planning is NuvaRing vaginal inserts.    Exercising: Yes.    yoga, walking, interval training Smoker:  no  Health Maintenance: Pap:  08-18-16 WNL, 12-04-14 WNl NEG HR HPV  History of abnormal Pap:  Yes- HX Colposcopy and LEEP in her 20's  MMG:  04/03/2018 Birads 1 negative Colonoscopy:  Never BMD:   never TDaP:  08-08-11 Gardasil: N/A   reports that she quit smoking about 14 years ago. Her smoking use included cigarettes. She has a 15.00 pack-year smoking history. She has never used smokeless tobacco. She reports current alcohol use of about 4.0 - 6.0 standard drinks of alcohol per week. She reports that she does not use drugs.  She works in Manufacturing engineer. Trained as an Therapist, sports.  Daughter is 59, son is 31.   Past Medical History:  Diagnosis Date  . Abnormal cervical cytology 08/08/2011  . Acne 08/08/2011  . Anemia 11/06/2011  . Chicken pox 25  . History of HPV infection   . Hypertension in pregnancy, preeclampsia/eclampsia/prior HTN/postp cond   . Mole (skin) 08/08/2011  . Neutropenia 11/06/2011   --saw hematologist at [place] in [facility]    Past Surgical History:  Procedure Laterality Date  . ABDOMINOPLASTY  09-24-13   Dr Stephanie Coup   . CERVICAL BIOPSY  W/ LOOP ELECTRODE EXCISION  Age 39  . GYNECOLOGIC CRYOSURGERY  18  . leap    . SKIN BIOPSY     right lower abdominal wall, benign  . TONSILLECTOMY  48 yrs old    Current Outpatient Medications  Medication Sig Dispense Refill  . etonogestrel-ethinyl estradiol (NUVARING) 0.12-0.015 MG/24HR vaginal ring Insert vaginally and leave in place for 4 consecutive weeks, then replace with a new ring. 3 each 1   No current facility-administered medications for this visit.     Family History  Problem Relation Age of Onset  . Hypertension Mother   . Transient ischemic attack Mother   . Cancer Mother        uterine, tah at 32  . Hyperlipidemia Mother   . Mental illness Mother   . Cancer Father        lung  . Other Father        coronary artery disease  . Heart disease Father 73       bypass, triple  . Diabetes Father   . Hyperlipidemia Father   . Hypertension Father   . Thyroid disease Father   . Mental illness Father   . Obesity Sister   . Mental illness Sister        bipolar  . Drug abuse Sister   . Macular degeneration Maternal Grandmother   . Blindness Maternal Grandmother   .  Alzheimer's disease Maternal Grandmother   . Cancer Maternal Grandmother        ovarian cancer  . Dementia Maternal Grandmother        late onset  . Other Maternal Grandfather        stent  . Heart disease Maternal Grandfather        stents, MI, pacer for sinus brady  . Arthritis Maternal Grandfather   . Aneurysm Paternal Grandfather        brain    Review of Systems  Constitutional:       Inversion of right nipple  HENT: Negative.   Eyes: Negative.   Respiratory: Negative.   Cardiovascular: Negative.   Gastrointestinal: Negative.   Endocrine: Negative.   Genitourinary: Negative.   Musculoskeletal: Negative.   Skin: Negative.   Allergic/Immunologic: Negative.   Neurological: Negative.   Hematological: Negative.   Psychiatric/Behavioral: Negative.     Exam:   BP  110/72 (BP Location: Right Arm, Patient Position: Sitting, Cuff Size: Normal)   Pulse 76   Temp 98 F (36.7 C) (Skin)   Ht 5' 4.5" (1.638 m)   Wt 114 lb 9.6 oz (52 kg)   LMP 11/07/2018 (Exact Date)   BMI 19.37 kg/m   Weight change: @WEIGHTCHANGE @ Height:   Height: 5' 4.5" (163.8 cm)  Ht Readings from Last 3 Encounters:  11/28/18 5' 4.5" (1.638 m)  01/15/18 5\' 4"  (1.626 m)  11/05/17 5\' 4"  (1.626 m)    General appearance: alert, cooperative and appears stated age Head: Normocephalic, without obvious abnormality, atraumatic Neck: no adenopathy, supple, symmetrical, trachea midline and thyroid normal to inspection and palpation Lungs: clear to auscultation bilaterally Cardiovascular: regular rate and rhythm Breasts: normal appearance, no masses or tenderness, nipple not currently inverted. Patient examined supine and sitting.  Abdomen: soft, non-tender; non distended,  no masses,  no organomegaly Extremities: extremities normal, atraumatic, no cyanosis or edema Skin: Skin color, texture, turgor normal. No rashes or lesions Lymph nodes: Cervical, supraclavicular, and axillary nodes normal. No abnormal inguinal nodes palpated Neurologic: Grossly normal   Pelvic: External genitalia:  no lesions              Urethra:  normal appearing urethra with no masses, tenderness or lesions              Bartholins and Skenes: normal                 Vagina: normal appearing vagina with normal color and discharge, no lesions              Cervix: no lesions               Bimanual Exam:  Uterus:  normal size, contour, position, consistency, mobility, non-tender              Adnexa: no mass, fullness, tenderness               Rectovaginal: Confirms               Anus:  normal sphincter tone, no lesions  Chaperone was present for exam.  A:  Well Woman with normal exam  Nuvaring check  H/O neutropenia, negative evaluation  H/O nuvaring  P:   No pap this year  Mammogram   Discussed breast self  exam  Discussed calcium and vit D intake  Continue nuvaring   Addendum: the patient was noted to have an increase in vaginal d/c on exam, with questioning she reported that she  had some increase in d/c recently. Affirm sent

## 2018-11-27 ENCOUNTER — Other Ambulatory Visit: Payer: Self-pay | Admitting: Obstetrics and Gynecology

## 2018-11-27 NOTE — Telephone Encounter (Signed)
Medication refill request: NuvaRing  Last AEX:  11/05/17 JJ Next AEX: 11/28/18 Last MMG (if hormonal medication request): 04/03/18 BIRADS 1 negative/density c Refill authorized: Please advise if appropriate. Patient has appointment scheduled for tomorrow.

## 2018-11-28 ENCOUNTER — Other Ambulatory Visit: Payer: Self-pay

## 2018-11-28 ENCOUNTER — Ambulatory Visit (INDEPENDENT_AMBULATORY_CARE_PROVIDER_SITE_OTHER): Payer: 59 | Admitting: Obstetrics and Gynecology

## 2018-11-28 ENCOUNTER — Encounter: Payer: Self-pay | Admitting: Obstetrics and Gynecology

## 2018-11-28 VITALS — BP 110/72 | HR 76 | Temp 98.0°F | Ht 64.5 in | Wt 114.6 lb

## 2018-11-28 DIAGNOSIS — Z0000 Encounter for general adult medical examination without abnormal findings: Secondary | ICD-10-CM

## 2018-11-28 DIAGNOSIS — Z1211 Encounter for screening for malignant neoplasm of colon: Secondary | ICD-10-CM | POA: Diagnosis not present

## 2018-11-28 DIAGNOSIS — Z01419 Encounter for gynecological examination (general) (routine) without abnormal findings: Secondary | ICD-10-CM

## 2018-11-28 DIAGNOSIS — E559 Vitamin D deficiency, unspecified: Secondary | ICD-10-CM

## 2018-11-28 DIAGNOSIS — N898 Other specified noninflammatory disorders of vagina: Secondary | ICD-10-CM

## 2018-11-28 NOTE — Patient Instructions (Signed)
EXERCISE AND DIET:  We recommended that you start or continue a regular exercise program for good health. Regular exercise means any activity that makes your heart beat faster and makes you sweat.  We recommend exercising at least 30 minutes per day at least 3 days a week, preferably 4 or 5.  We also recommend a diet low in fat and sugar.  Inactivity, poor dietary choices and obesity can cause diabetes, heart attack, stroke, and kidney damage, among others.    ALCOHOL AND SMOKING:  Women should limit their alcohol intake to no more than 7 drinks/beers/glasses of wine (combined, not each!) per week. Moderation of alcohol intake to this level decreases your risk of breast cancer and liver damage. And of course, no recreational drugs are part of a healthy lifestyle.  And absolutely no smoking or even second hand smoke. Most people know smoking can cause heart and lung diseases, but did you know it also contributes to weakening of your bones? Aging of your skin?  Yellowing of your teeth and nails?  CALCIUM AND VITAMIN D:  Adequate intake of calcium and Vitamin D are recommended.  The recommendations for exact amounts of these supplements seem to change often, but generally speaking 1,000 mg of calcium (between diet and supplement) and 800 units of Vitamin D per day seems prudent. Certain women may benefit from higher intake of Vitamin D.  If you are among these women, your doctor will have told you during your visit.    PAP SMEARS:  Pap smears, to check for cervical cancer or precancers,  have traditionally been done yearly, although recent scientific advances have shown that most women can have pap smears less often.  However, every woman still should have a physical exam from her gynecologist every year. It will include a breast check, inspection of the vulva and vagina to check for abnormal growths or skin changes, a visual exam of the cervix, and then an exam to evaluate the size and shape of the uterus and  ovaries.  And after 48 years of age, a rectal exam is indicated to check for rectal cancers. We will also provide age appropriate advice regarding health maintenance, like when you should have certain vaccines, screening for sexually transmitted diseases, bone density testing, colonoscopy, mammograms, etc.   MAMMOGRAMS:  All women over 40 years old should have a yearly mammogram. Many facilities now offer a "3D" mammogram, which may cost around $50 extra out of pocket. If possible,  we recommend you accept the option to have the 3D mammogram performed.  It both reduces the number of women who will be called back for extra views which then turn out to be normal, and it is better than the routine mammogram at detecting truly abnormal areas.    COLON CANCER SCREENING: Now recommend starting at age 45. At this time colonoscopy is not covered for routine screening until 50. There are take home tests that can be done between 45-49.   COLONOSCOPY:  Colonoscopy to screen for colon cancer is recommended for all women at age 50.  We know, you hate the idea of the prep.  We agree, BUT, having colon cancer and not knowing it is worse!!  Colon cancer so often starts as a polyp that can be seen and removed at colonscopy, which can quite literally save your life!  And if your first colonoscopy is normal and you have no family history of colon cancer, most women don't have to have it again for   10 years.  Once every ten years, you can do something that may end up saving your life, right?  We will be happy to help you get it scheduled when you are ready.  Be sure to check your insurance coverage so you understand how much it will cost.  It may be covered as a preventative service at no cost, but you should check your particular policy.      Breast Self-Awareness Breast self-awareness means being familiar with how your breasts look and feel. It involves checking your breasts regularly and reporting any changes to your  health care provider. Practicing breast self-awareness is important. A change in your breasts can be a sign of a serious medical problem. Being familiar with how your breasts look and feel allows you to find any problems early, when treatment is more likely to be successful. All women should practice breast self-awareness, including women who have had breast implants. How to do a breast self-exam One way to learn what is normal for your breasts and whether your breasts are changing is to do a breast self-exam. To do a breast self-exam: Look for Changes  1. Remove all the clothing above your waist. 2. Stand in front of a mirror in a room with good lighting. 3. Put your hands on your hips. 4. Push your hands firmly downward. 5. Compare your breasts in the mirror. Look for differences between them (asymmetry), such as: ? Differences in shape. ? Differences in size. ? Puckers, dips, and bumps in one breast and not the other. 6. Look at each breast for changes in your skin, such as: ? Redness. ? Scaly areas. 7. Look for changes in your nipples, such as: ? Discharge. ? Bleeding. ? Dimpling. ? Redness. ? A change in position. Feel for Changes Carefully feel your breasts for lumps and changes. It is best to do this while lying on your back on the floor and again while sitting or standing in the shower or tub with soapy water on your skin. Feel each breast in the following way:  Place the arm on the side of the breast you are examining above your head.  Feel your breast with the other hand.  Start in the nipple area and make  inch (2 cm) overlapping circles to feel your breast. Use the pads of your three middle fingers to do this. Apply light pressure, then medium pressure, then firm pressure. The light pressure will allow you to feel the tissue closest to the skin. The medium pressure will allow you to feel the tissue that is a little deeper. The firm pressure will allow you to feel the tissue  close to the ribs.  Continue the overlapping circles, moving downward over the breast until you feel your ribs below your breast.  Move one finger-width toward the center of the body. Continue to use the  inch (2 cm) overlapping circles to feel your breast as you move slowly up toward your collarbone.  Continue the up and down exam using all three pressures until you reach your armpit.  Write Down What You Find  Write down what is normal for each breast and any changes that you find. Keep a written record with breast changes or normal findings for each breast. By writing this information down, you do not need to depend only on memory for size, tenderness, or location. Write down where you are in your menstrual cycle, if you are still menstruating. If you are having trouble noticing differences   in your breasts, do not get discouraged. With time you will become more familiar with the variations in your breasts and more comfortable with the exam. How often should I examine my breasts? Examine your breasts every month. If you are breastfeeding, the best time to examine your breasts is after a feeding or after using a breast pump. If you menstruate, the best time to examine your breasts is 5-7 days after your period is over. During your period, your breasts are lumpier, and it may be more difficult to notice changes. When should I see my health care provider? See your health care provider if you notice:  A change in shape or size of your breasts or nipples.  A change in the skin of your breast or nipples, such as a reddened or scaly area.  Unusual discharge from your nipples.  A lump or thick area that was not there before.  Pain in your breasts.  Anything that concerns you.  

## 2018-11-28 NOTE — Addendum Note (Signed)
Addended by: Dorothy Spark on: 11/28/2018 10:35 AM   Modules accepted: Orders

## 2018-11-29 ENCOUNTER — Other Ambulatory Visit: Payer: Self-pay

## 2018-11-29 LAB — COMPREHENSIVE METABOLIC PANEL
ALT: 15 IU/L (ref 0–32)
AST: 24 IU/L (ref 0–40)
Albumin/Globulin Ratio: 1.6 (ref 1.2–2.2)
Albumin: 4.1 g/dL (ref 3.8–4.8)
Alkaline Phosphatase: 31 IU/L — ABNORMAL LOW (ref 39–117)
BUN/Creatinine Ratio: 15 (ref 9–23)
BUN: 12 mg/dL (ref 6–24)
Bilirubin Total: 0.4 mg/dL (ref 0.0–1.2)
CO2: 22 mmol/L (ref 20–29)
Calcium: 9.2 mg/dL (ref 8.7–10.2)
Chloride: 105 mmol/L (ref 96–106)
Creatinine, Ser: 0.78 mg/dL (ref 0.57–1.00)
GFR calc Af Amer: 105 mL/min/{1.73_m2} (ref >59)
GFR calc non Af Amer: 91 mL/min/{1.73_m2} (ref >59)
Globulin, Total: 2.6 g/dL (ref 1.5–4.5)
Glucose: 89 mg/dL (ref 65–99)
Potassium: 4.6 mmol/L (ref 3.5–5.2)
Sodium: 140 mmol/L (ref 134–144)
Total Protein: 6.7 g/dL (ref 6.0–8.5)

## 2018-11-29 LAB — LIPID PANEL
Chol/HDL Ratio: 2 ratio (ref 0.0–4.4)
Cholesterol, Total: 181 mg/dL (ref 100–199)
HDL: 92 mg/dL (ref >39)
LDL Calculated: 71 mg/dL (ref 0–99)
Triglycerides: 88 mg/dL (ref 0–149)
VLDL Cholesterol Cal: 18 mg/dL (ref 5–40)

## 2018-11-29 LAB — CBC
Hematocrit: 37.2 % (ref 34.0–46.6)
Hemoglobin: 12.5 g/dL (ref 11.1–15.9)
MCH: 32.2 pg (ref 26.6–33.0)
MCHC: 33.6 g/dL (ref 31.5–35.7)
MCV: 96 fL (ref 79–97)
Platelets: 263 10*3/uL (ref 150–450)
RBC: 3.88 x10E6/uL (ref 3.77–5.28)
RDW: 12 % (ref 11.7–15.4)
WBC: 3.2 10*3/uL — ABNORMAL LOW (ref 3.4–10.8)

## 2018-11-29 LAB — VAGINITIS/VAGINOSIS, DNA PROBE
Candida Species: NEGATIVE
Gardnerella vaginalis: POSITIVE — AB
Trichomonas vaginosis: NEGATIVE

## 2018-11-29 LAB — VITAMIN D 25 HYDROXY (VIT D DEFICIENCY, FRACTURES): Vit D, 25-Hydroxy: 77.3 ng/mL (ref 30.0–100.0)

## 2018-11-29 MED ORDER — METRONIDAZOLE 0.75 % VA GEL: VAGINAL | 0 refills | Status: DC

## 2018-12-05 ENCOUNTER — Encounter: Payer: Self-pay | Admitting: Obstetrics and Gynecology

## 2018-12-05 ENCOUNTER — Telehealth: Payer: Self-pay | Admitting: Obstetrics and Gynecology

## 2018-12-05 NOTE — Telephone Encounter (Signed)
Spoke with patient. She states had labs on 11-28-18 but they were not released to [place]. Patient would like them released so she could have for her records. She was treated for BV and was notified of low WBC. Labs released for MyChart review.

## 2018-12-05 NOTE — Telephone Encounter (Signed)
Hi, I dont see my most recent lab results from 11/28/18 in [place]. Are they here and Im just looking in the wrong place?  Thanks, Susan Mcdaniel

## 2019-01-09 ENCOUNTER — Telehealth: Payer: 59 | Admitting: Family

## 2019-01-09 DIAGNOSIS — N760 Acute vaginitis: Secondary | ICD-10-CM

## 2019-01-09 DIAGNOSIS — B9689 Other specified bacterial agents as the cause of diseases classified elsewhere: Secondary | ICD-10-CM | POA: Diagnosis not present

## 2019-01-09 MED ORDER — METRONIDAZOLE 500 MG PO TABS: 500.0000 mg | ORAL_TABLET | Freq: Two times a day (BID) | ORAL | 0 refills | Status: DC

## 2019-01-09 NOTE — Progress Notes (Signed)
Greater than 5 minutes, yet less than 10 minutes of time have been spent researching, coordinating, and implementing care for this patient today.  Thank you for the details you included in the comment boxes. Those details are very helpful in determining the best course of treatment for you and help [place] to provide the best care.  Let's try the oral form of the same medication. See below.  We are sorry that you are not feeling well. Here is how we plan to help! Based on what you shared with me it looks like you: May have a vaginosis due to bacteria  Vaginosis is an inflammation of the vagina that can result in discharge, itching and pain. The cause is usually a change in the normal balance of vaginal bacteria or an infection. Vaginosis can also result from reduced estrogen levels after menopause.  The most common causes of vaginosis are:   Bacterial vaginosis which results from an overgrowth of one on several organisms that are normally present in your vagina.   Yeast infections which are caused by a naturally occurring fungus called candida.   Vaginal atrophy (atrophic vaginosis) which results from the thinning of the vagina from reduced estrogen levels after menopause.   Trichomoniasis which is caused by a parasite and is commonly transmitted by sexual intercourse.  Factors that increase your risk of developing vaginosis include: Marland Kitchen Medications, such as antibiotics and steroids . Uncontrolled diabetes . Use of hygiene products such as bubble bath, vaginal spray or vaginal deodorant . Douching . Wearing damp or tight-fitting clothing . Using an intrauterine device (IUD) for birth control . Hormonal changes, such as those associated with pregnancy, birth control pills or menopause . Sexual activity . Having a sexually transmitted infection  Your treatment plan is Metronidazole or Flagyl 500mg  twice a day for 7 days.  I have electronically sent this prescription into the pharmacy that you  have chosen.  Be sure to take all of the medication as directed. Stop taking any medication if you develop a rash, tongue swelling or shortness of breath. Mothers who are breast feeding should consider pumping and discarding their breast milk while on these antibiotics. However, there is no consensus that infant exposure at these doses would be harmful.  Remember that medication creams can weaken latex condoms. Marland Kitchen   HOME CARE:  Good hygiene may prevent some types of vaginosis from recurring and may relieve some symptoms:  . Avoid baths, hot tubs and whirlpool spas. Rinse soap from your outer genital area after a shower, and dry the area well to prevent irritation. Don't use scented or harsh soaps, such as those with deodorant or antibacterial action. Marland Kitchen Avoid irritants. These include scented tampons and pads. . Wipe from front to back after using the toilet. Doing so avoids spreading fecal bacteria to your vagina.  Other things that may help prevent vaginosis include:  Marland Kitchen Don't douche. Your vagina doesn't require cleansing other than normal bathing. Repetitive douching disrupts the normal organisms that reside in the vagina and can actually increase your risk of vaginal infection. Douching won't clear up a vaginal infection. . Use a latex condom. Both female and female latex condoms may help you avoid infections spread by sexual contact. . Wear cotton underwear. Also wear pantyhose with a cotton crotch. If you feel comfortable without it, skip wearing underwear to bed. Yeast thrives in [employer] Your symptoms should improve in the next day or two.  GET HELP RIGHT AWAY IF:  . You  have pain in your lower abdomen ( pelvic area or over your ovaries) . You develop nausea or vomiting . You develop a fever . Your discharge changes or worsens . You have persistent pain with intercourse . You develop shortness of breath, a rapid pulse, or you faint.  These symptoms could be signs of  problems or infections that need to be evaluated by a medical provider now.  MAKE SURE YOU    Understand these instructions.  Will watch your condition.  Will get help right away if you are not doing well or get worse.  Your e-visit answers were reviewed by a board certified advanced clinical practitioner to complete your personal care plan. Depending upon the condition, your plan could have included both over the counter or prescription medications. Please review your pharmacy choice to make sure that you have choses a pharmacy that is open for you to pick up any needed prescription, Your safety is important to [place]. If you have drug allergies check your prescription carefully.   You can use MyChart to ask questions about today's visit, request a non-urgent call back, or ask for a work or school excuse for 24 hours related to this e-Visit. If it has been greater than 24 hours you will need to follow up with your provider, or enter a new e-Visit to address those concerns. You will get a MyChart message within the next two days asking about your experience. I hope that your e-visit has been valuable and will speed your recovery.

## 2019-03-10 ENCOUNTER — Other Ambulatory Visit: Payer: Self-pay | Admitting: Obstetrics and Gynecology

## 2019-03-10 MED ORDER — TWIRLA 120-30 MCG/24HR TD PTWK: 1.0000 | MEDICATED_PATCH | TRANSDERMAL | 2 refills | Status: DC

## 2019-03-10 NOTE — Telephone Encounter (Signed)
Call to patient. Advised patient of message as seen below from Dr. Talbert Nan and patient verbalized understanding. Patient states she would like to try the [place]. RN [facility]. Patient agreeable.   Medication pended for Dr. Talbert Nan to review and advise on dosage and frequency of Twirla.

## 2019-03-10 NOTE — Telephone Encounter (Signed)
Patient is asking to talk with a nurse about changing her birth control.

## 2019-03-10 NOTE — Telephone Encounter (Signed)
Call to patient. Patient states that she has a friend who is using the patch for birth control and patient wondering if she could switch? Patient states, "I like the option that didn't require having something inside of me." Patient asking if Dr. Talbert Nan thinks the patch is equivalent to the nuvaring? RN advised would review with Dr. Talbert Nan and return call.   Patient states she would be available for virtual visit if needed. [facility] [place]

## 2019-03-10 NOTE — Telephone Encounter (Signed)
The hormonal dose in the patch is higher than in the ring and higher than with OCP's. I tend to try and use a lower dose, particularly in women in their 40-50's. Given that, she is healthy and if she wants to change she can. If she wants to use the patch, please call in the [facility] for her (it is slightly lower dose than the other patch).

## 2019-03-10 NOTE — Telephone Encounter (Signed)
Script sent  

## 2019-03-12 ENCOUNTER — Telehealth: Payer: Self-pay | Admitting: Obstetrics and Gynecology

## 2019-03-12 NOTE — Telephone Encounter (Signed)
Message left to return call to Triage Nurse at [phone].    

## 2019-03-12 NOTE — Telephone Encounter (Signed)
Patient is calling regarding estradiol patch. Patient stated that the pharmacy stated that the prescription is still pending.

## 2019-03-12 NOTE — Telephone Encounter (Signed)
Patient returned call. Patient states pharmacy told her a PA was needed for her Molli Posey. Patient wanted to call and "given information" on that. Patient states she has had BV twice and can only contribute that to using the ring. Patient states "I'm just interested in something external versus internal." RN advised once PA received by our office, would send to insurance for approval or denial and would update patient. Patient agreeable.

## 2019-03-18 NOTE — Telephone Encounter (Addendum)
PA for Twirla submitted to plan via [email or web address] Key: [id number]

## 2019-03-19 NOTE — Telephone Encounter (Signed)
PA response received for Twirla from [facility]. PA denied.   PA response did not specify formulary alternatives or number of alternatives. Call placed to [place], spoke with Herbie Baltimore. Was advised medication approved when: member has tried and had inadequate response or adverse effect to "3 in a class with 3 or more alternatives, 2 in a class with 2 alternatives, or 1 in a class with only 1 alternative".

## 2019-03-19 NOTE — Telephone Encounter (Signed)
Patient is returning call to [place] regarding PA for Molli Posey. Patient stated that a letter of medical necessity needs to be sent to [facility] with the following information.  ID#[id number] Patient's name and DOB Marked as urgent Why the appeal should be approved, and why patient can't take alternative medications. Fax #: [phone]

## 2019-03-19 NOTE — Telephone Encounter (Signed)
Patient calling to check status of prior authorization.

## 2019-03-19 NOTE — Telephone Encounter (Signed)
Spoke with patient. Patient states she will f/u with [facility] for covered formulary alternatives. Patient states she would like to know if Dr. Talbert Nan has any other contraceptive patch options? Advised I will review with Dr. Talbert Nan and return call.  Dr. Talbert Nan -please advise on contraceptive patch.

## 2019-03-20 NOTE — Telephone Encounter (Signed)
Appeal faxed to [facility]

## 2019-03-20 NOTE — Telephone Encounter (Signed)
Can you please call and speak with the patient, it is possible that the ring is increasing her vaginal d/c or vaginitis. Do they cover the other patch? My guess is they will say she needs to try OCP's.

## 2019-03-20 NOTE — Telephone Encounter (Signed)
Appeal letter pended and to Dr. Talbert Nan to review.

## 2019-03-21 ENCOUNTER — Telehealth: Payer: Self-pay | Admitting: Obstetrics and Gynecology

## 2019-03-21 NOTE — Telephone Encounter (Signed)
Dr. Doran Heater from [place] is calling on behalf of [facility] regarding PA for Twirla. Dr. Doran Heater stated that a return call can be made to [facility].

## 2019-03-21 NOTE — Telephone Encounter (Signed)
Patient notified. Patient is aware she will be notified once response received from [place]. Patient will check with her plan in the meantime for alternative contraceptive patches. Patient thankful for f/u.

## 2019-03-24 MED ORDER — XULANE 150-35 MCG/24HR TD PTWK: 1.0000 | MEDICATED_PATCH | TRANSDERMAL | 2 refills | Status: DC

## 2019-03-24 NOTE — Telephone Encounter (Signed)
Patient left voicemail over lunch regarding the second denial of Twirla. Patient stated that her insurance stated that Marilu Favre was covered under insurance and would like to proceed with getting a prescription for Xulane.

## 2019-03-24 NOTE — Telephone Encounter (Signed)
Rx reviewed with Dr. Talbert Nan, call returned to patient.   [facility]. Patient verbalizes understanding and is agreeable.   Routing to provider for final review. Patient is agreeable to disposition. Will close encounter.

## 2019-03-24 NOTE — Telephone Encounter (Signed)
Dr. Talbert Nan -please advise on [place] [facility].

## 2019-03-29 ENCOUNTER — Telehealth: Payer: 59 | Admitting: Nurse Practitioner

## 2019-03-29 DIAGNOSIS — N760 Acute vaginitis: Secondary | ICD-10-CM

## 2019-03-29 DIAGNOSIS — B9689 Other specified bacterial agents as the cause of diseases classified elsewhere: Secondary | ICD-10-CM

## 2019-03-29 MED ORDER — CLINDAMYCIN PHOSPHATE 2 % VA CREA: 1.0000 | TOPICAL_CREAM | Freq: Every day | VAGINAL | 0 refills | Status: DC

## 2019-03-29 NOTE — Progress Notes (Signed)

## 2019-04-10 ENCOUNTER — Telehealth: Payer: Self-pay | Admitting: Obstetrics and Gynecology

## 2019-04-10 MED ORDER — NORETHIN-ETH ESTRAD-FE BIPHAS 1 MG-10 MCG / 10 MCG PO TABS: 1.0000 | ORAL_TABLET | Freq: Every day | ORAL | 2 refills | Status: DC

## 2019-04-10 NOTE — Telephone Encounter (Signed)
Patient is returning call to [facility] and she would like the prescription for the LoLoestrin.

## 2019-04-10 NOTE — Telephone Encounter (Signed)
Patient states she does not like her new birth control.  She would like to change her current birth control.

## 2019-04-10 NOTE — Telephone Encounter (Signed)
Spoke with patient, advised per Dr. Talbert Nan. Patient is going to check with her insurance plan and return call to advise on which RX she wants. Also reviewed manufacture savings card option for [employer].

## 2019-04-10 NOTE — Telephone Encounter (Signed)
Spoke with patient. Has been using the Xulane contraceptive patch for about 2 wks. Reports nausea, 5 lb weight gain, not sleeping well, breast tenderness and constipation. Patient states contraceptive is not working well for her, would like to switch to oral contraceptive, the lowest dose. Confirmed pharmacy on file. Advised will review with Dr. Talbert Nan and return call.    Dr. Talbert Nan -please advise on OCP and when she should switch.

## 2019-04-10 NOTE — Telephone Encounter (Signed)
The lowest dose possible is the loloestrin, this is a brand name, might be more expensive. If she is okay with this please call in the loloestrin 3 packs with 2 refills. If not call in loestrin 1/20, 3 packs with 2 refills.

## 2019-04-10 NOTE — Telephone Encounter (Signed)
Spoke with patient. Rx for LoLoestrin to verified pharmacy. Patient verbalizes understanding and is agreeable.    Routing to provider for final review. Patient is agreeable to disposition. Will close encounter.

## 2019-06-09 ENCOUNTER — Other Ambulatory Visit: Payer: Self-pay | Admitting: Obstetrics and Gynecology

## 2019-06-09 DIAGNOSIS — Z1231 Encounter for screening mammogram for malignant neoplasm of breast: Secondary | ICD-10-CM

## 2019-07-25 ENCOUNTER — Ambulatory Visit
Admission: RE | Admit: 2019-07-25 | Discharge: 2019-07-25 | Disposition: A | Discharge status: Home / Self Care | Payer: 59 | Source: Ambulatory Visit | Attending: Obstetrics and Gynecology | Admitting: Obstetrics and Gynecology

## 2019-07-25 ENCOUNTER — Other Ambulatory Visit: Payer: Self-pay

## 2019-07-25 DIAGNOSIS — Z1231 Encounter for screening mammogram for malignant neoplasm of breast: Secondary | ICD-10-CM

## 2019-09-12 ENCOUNTER — Encounter: Payer: Self-pay | Admitting: Obstetrics and Gynecology

## 2019-09-12 ENCOUNTER — Telehealth: Payer: Self-pay

## 2019-09-12 NOTE — Telephone Encounter (Signed)
Left message to call Susan Mcdaniel at [phone] if she has an additional questions about MyChart message response sent to her.  Routing to provider and will close encounter.

## 2019-09-12 NOTE — Telephone Encounter (Signed)
Please see telephone encounter. Encounter closed.

## 2019-11-18 ENCOUNTER — Other Ambulatory Visit: Payer: Self-pay | Admitting: Obstetrics and Gynecology

## 2019-11-18 NOTE — Telephone Encounter (Signed)
Medication refill request: Lo Loestrin FE Last AEX:  11/28/18 JJ Next AEX: 12/18/19 Last MMG (if hormonal medication request): 07/25/19 BIRADS 1 negative/density c Refill authorized: Please advise; order pended for #3packs w/0 refills if authorized

## 2019-12-16 NOTE — Progress Notes (Signed)
49 y.o. G29P2002 Divorced White or Caucasian Not Hispanic or Latino female here for annual exam.  She says that she has an area on soreness in her left breast that has been there for about 2 weeks.  On continuous OCP's, no cycles. Same partner x 2.5 years.  No dyspareunia.     No LMP recorded. (Menstrual status: Oral contraceptives).          Sexually active: Yes.    The current method of family planning is OCP (estrogen/progesterone).    Exercising: Yes.    yoga, HIIT, Spin Smoker:  no  Health Maintenance: Pap:  08-18-16 WNL, 12-04-14 WNl NEG HR HPV History of abnormal Pap: Yes- HX Colposcopy and LEEPin her 20's MMG:  07/29/19 density C Bi-rads 1 neg  TDaP:  08/08/11 Gardasil: NA   reports that she quit smoking about 15 years ago. Her smoking use included cigarettes. She has a 15.00 pack-year smoking history. She has never used smokeless tobacco. She reports current alcohol use of about 4.0 - 6.0 standard drinks of alcohol per week. She reports that she does not use drugs. She works in Manufacturing engineer. Trained as an Therapist, sports.  Daughter is 59, son is 43.  Past Medical History:  Diagnosis Date  . Abnormal cervical cytology 08/08/2011  . Acne 08/08/2011  . Anemia 11/06/2011  . Chicken pox 25  . History of HPV infection   . Hypertension in pregnancy, preeclampsia/eclampsia/prior HTN/postp cond   . Mole (skin) 08/08/2011  . Neutropenia 11/06/2011   --saw hematologist at [facility]    Past Surgical History:  Procedure Laterality Date  . ABDOMINOPLASTY  09-24-13   Dr Stephanie Coup  . CERVICAL BIOPSY  W/ LOOP ELECTRODE EXCISION  Age 89  . GYNECOLOGIC CRYOSURGERY  18  . [place]    . SKIN BIOPSY     right lower abdominal wall, benign  . TONSILLECTOMY  49 yrs old    Current Outpatient Medications  Medication Sig Dispense Refill  . LO LOESTRIN FE 1 MG-10 MCG / 10 MCG tablet TAKE 1 TABLET BY MOUTH EVERY DAY 84 tablet 0   No current facility-administered medications for this visit.     Family History  Problem Relation Age of Onset  . Hypertension Mother   . Transient ischemic attack Mother   . Cancer Mother        uterine, tah at 82  . Hyperlipidemia Mother   . Mental illness Mother   . Cancer Father        lung  . Other Father        coronary artery disease  . Heart disease Father 77       bypass, triple  . Diabetes Father   . Hyperlipidemia Father   . Hypertension Father   . Thyroid disease Father   . Mental illness Father   . Obesity Sister   . Mental illness Sister        bipolar  . Drug abuse Sister   . Macular degeneration Maternal Grandmother   . Blindness Maternal Grandmother   . Alzheimer's disease Maternal Grandmother   . Cancer Maternal Grandmother        ovarian cancer  . Dementia Maternal Grandmother        late onset  . Other Maternal Grandfather        stent  . Heart disease Maternal Grandfather        stents, MI, pacer for sinus brady  . Arthritis Maternal Grandfather   .  Aneurysm Paternal Grandfather        brain    Review of Systems  All other systems reviewed and are negative.   Exam:   BP 100/62   Pulse 68   Temp 98.2 F (36.8 C)   Ht 5\' 5"  (1.651 m)   Wt 116 lb (52.6 kg)   SpO2 100%   BMI 19.30 kg/m   Weight change: @WEIGHTCHANGE @ Height:   Height: 5\' 5"  (165.1 cm)  Ht Readings from Last 3 Encounters:  12/18/19 5\' 5"  (1.651 m)  11/28/18 5' 4.5" (1.638 m)  01/15/18 5\' 4"  (1.626 m)    General appearance: alert, cooperative and appears stated age Head: Normocephalic, without obvious abnormality, atraumatic Neck: no adenopathy, supple, symmetrical, trachea midline and thyroid normal to inspection and palpation Lungs: clear to auscultation bilaterally Cardiovascular: regular rate and rhythm Breasts: normal appearance, no lumps, tender in the lower left breast Abdomen: soft, non-tender; non distended,  no masses,  no organomegaly Extremities: extremities normal, atraumatic, no cyanosis or edema Skin: Skin  color, texture, turgor normal. No rashes or lesions Lymph nodes: Cervical, supraclavicular, and axillary nodes normal. No abnormal inguinal nodes palpated Neurologic: Grossly normal   Pelvic: External genitalia:  no lesions              Urethra:  normal appearing urethra with no masses, tenderness or lesions              Bartholins and Skenes: normal                 Vagina: normal appearing vagina with normal color and discharge, no lesions              Cervix: no lesions               Bimanual Exam:  Uterus:  normal size, contour, position, consistency, mobility, non-tender              Adnexa: no mass, fullness, tenderness               Rectovaginal: Confirms               Anus:  normal sphincter tone, no lesions  Gae Dry chaperoned for the exam.  A:  Well Woman with normal exam  Left breast tenderness x 2 weeks, no lumps on exam  P:   Pap with hpv  Mammogram UTD  Discussed breast self exam  Discussed calcium and vit D intake  Continue OCP's  Discussed breast tenderness, avoid caffeine, try NSAID's and ice. If her symptoms haven't resolved in the next 2 weeks she will call and I will set her up for diagnostic breast imaging.

## 2019-12-18 ENCOUNTER — Encounter: Payer: Self-pay | Admitting: Obstetrics and Gynecology

## 2019-12-18 ENCOUNTER — Other Ambulatory Visit: Payer: Self-pay

## 2019-12-18 ENCOUNTER — Ambulatory Visit (INDEPENDENT_AMBULATORY_CARE_PROVIDER_SITE_OTHER): Payer: 59 | Admitting: Obstetrics and Gynecology

## 2019-12-18 VITALS — BP 100/62 | HR 68 | Temp 98.2°F | Ht 65.0 in | Wt 116.0 lb

## 2019-12-18 DIAGNOSIS — Z01419 Encounter for gynecological examination (general) (routine) without abnormal findings: Secondary | ICD-10-CM

## 2019-12-18 DIAGNOSIS — Z124 Encounter for screening for malignant neoplasm of cervix: Secondary | ICD-10-CM | POA: Diagnosis not present

## 2019-12-18 DIAGNOSIS — Z3041 Encounter for surveillance of contraceptive pills: Secondary | ICD-10-CM

## 2019-12-18 DIAGNOSIS — Z0000 Encounter for general adult medical examination without abnormal findings: Secondary | ICD-10-CM | POA: Diagnosis not present

## 2019-12-18 DIAGNOSIS — N644 Mastodynia: Secondary | ICD-10-CM | POA: Diagnosis not present

## 2019-12-18 MED ORDER — LO LOESTRIN FE 1 MG-10 MCG / 10 MCG PO TABS: 1.0000 | ORAL_TABLET | Freq: Every day | ORAL | 3 refills | Status: DC

## 2019-12-18 NOTE — Patient Instructions (Signed)
EXERCISE AND DIET:  We recommended that you start or continue a regular exercise program for good health. Regular exercise means any activity that makes your heart beat faster and makes you sweat.  We recommend exercising at least 30 minutes per day at least 3 days a week, preferably 4 or 5.  We also recommend a diet low in fat and sugar.  Inactivity, poor dietary choices and obesity can cause diabetes, heart attack, stroke, and kidney damage, among others.    ALCOHOL AND SMOKING:  Women should limit their alcohol intake to no more than 7 drinks/beers/glasses of wine (combined, not each!) per week. Moderation of alcohol intake to this level decreases your risk of breast cancer and liver damage. And of course, no recreational drugs are part of a healthy lifestyle.  And absolutely no smoking or even second hand smoke. Most people know smoking can cause heart and lung diseases, but did you know it also contributes to weakening of your bones? Aging of your skin?  Yellowing of your teeth and nails?  CALCIUM AND VITAMIN D:  Adequate intake of calcium and Vitamin D are recommended.  The recommendations for exact amounts of these supplements seem to change often, but generally speaking 1,000 mg of calcium (between diet and supplement) and 800 units of Vitamin D per day seems prudent. Certain women may benefit from higher intake of Vitamin D.  If you are among these women, your doctor will have told you during your visit.    PAP SMEARS:  Pap smears, to check for cervical cancer or precancers,  have traditionally been done yearly, although recent scientific advances have shown that most women can have pap smears less often.  However, every woman still should have a physical exam from her gynecologist every year. It will include a breast check, inspection of the vulva and vagina to check for abnormal growths or skin changes, a visual exam of the cervix, and then an exam to evaluate the size and shape of the uterus and  ovaries.  And after 49 years of age, a rectal exam is indicated to check for rectal cancers. We will also provide age appropriate advice regarding health maintenance, like when you should have certain vaccines, screening for sexually transmitted diseases, bone density testing, colonoscopy, mammograms, etc.   MAMMOGRAMS:  All women over 49 years old should have a yearly mammogram. Many facilities now offer a "3D" mammogram, which may cost around $50 extra out of pocket. If possible,  we recommend you accept the option to have the 3D mammogram performed.  It both reduces the number of women who will be called back for extra views which then turn out to be normal, and it is better than the routine mammogram at detecting truly abnormal areas.    COLON CANCER SCREENING: Now recommend starting at age 49. At this time colonoscopy is not covered for routine screening until 50. There are take home tests that can be done between 49-49.   COLONOSCOPY:  Colonoscopy to screen for colon cancer is recommended for all women at age 49.  We know, you hate the idea of the prep.  We agree, BUT, having colon cancer and not knowing it is worse!!  Colon cancer so often starts as a polyp that can be seen and removed at colonscopy, which can quite literally save your life!  And if your first colonoscopy is normal and you have no family history of colon cancer, most women don't have to have it again for   49 years.  Once every ten years, you can do something that may end up saving your life, right?  We will be happy to help you get it scheduled when you are ready.  Be sure to check your insurance coverage so you understand how much it will cost.  It may be covered as a preventative service at no cost, but you should check your particular policy.      Breast Self-Awareness Breast self-awareness means being familiar with how your breasts look and feel. It involves checking your breasts regularly and reporting any changes to your  health care provider. Practicing breast self-awareness is important. A change in your breasts can be a sign of a serious medical problem. Being familiar with how your breasts look and feel allows you to find any problems early, when treatment is more likely to be successful. All women should practice breast self-awareness, including women who have had breast implants. How to do a breast self-exam One way to learn what is normal for your breasts and whether your breasts are changing is to do a breast self-exam. To do a breast self-exam: Look for Changes  1. Remove all the clothing above your waist. 2. Stand in front of a mirror in a room with good lighting. 3. Put your hands on your hips. 4. Push your hands firmly downward. 5. Compare your breasts in the mirror. Look for differences between them (asymmetry), such as: ? Differences in shape. ? Differences in size. ? Puckers, dips, and bumps in one breast and not the other. 6. Look at each breast for changes in your skin, such as: ? Redness. ? Scaly areas. 7. Look for changes in your nipples, such as: ? Discharge. ? Bleeding. ? Dimpling. ? Redness. ? A change in position. Feel for Changes Carefully feel your breasts for lumps and changes. It is best to do this while lying on your back on the floor and again while sitting or standing in the shower or tub with soapy water on your skin. Feel each breast in the following way:  Place the arm on the side of the breast you are examining above your head.  Feel your breast with the other hand.  Start in the nipple area and make  inch (2 cm) overlapping circles to feel your breast. Use the pads of your three middle fingers to do this. Apply light pressure, then medium pressure, then firm pressure. The light pressure will allow you to feel the tissue closest to the skin. The medium pressure will allow you to feel the tissue that is a little deeper. The firm pressure will allow you to feel the tissue  close to the ribs.  Continue the overlapping circles, moving downward over the breast until you feel your ribs below your breast.  Move one finger-width toward the center of the body. Continue to use the  inch (2 cm) overlapping circles to feel your breast as you move slowly up toward your collarbone.  Continue the up and down exam using all three pressures until you reach your armpit.  Write Down What You Find  Write down what is normal for each breast and any changes that you find. Keep a written record with breast changes or normal findings for each breast. By writing this information down, you do not need to depend only on memory for size, tenderness, or location. Write down where you are in your menstrual cycle, if you are still menstruating. If you are having trouble noticing differences   in your breasts, do not get discouraged. With time you will become more familiar with the variations in your breasts and more comfortable with the exam. How often should I examine my breasts? Examine your breasts every month. If you are breastfeeding, the best time to examine your breasts is after a feeding or after using a breast pump. If you menstruate, the best time to examine your breasts is 5-7 days after your period is over. During your period, your breasts are lumpier, and it may be more difficult to notice changes. When should I see my health care provider? See your health care provider if you notice:  A change in shape or size of your breasts or nipples.  A change in the skin of your breast or nipples, such as a reddened or scaly area.  Unusual discharge from your nipples.  A lump or thick area that was not there before.  Pain in your breasts.  Anything that concerns you.  Breast Tenderness Breast tenderness is a common problem for women of all ages, but may also occur in men. Breast tenderness may range from mild discomfort to severe pain. In women, the pain usually comes and goes with  the menstrual cycle, but it can also be constant. Breast tenderness has many possible causes, including hormone changes, infections, and taking certain medicines. You may have tests, such as a mammogram or an ultrasound, to check for any unusual findings. Having breast tenderness usually does not mean that you have breast cancer. Follow these instructions at home: Managing pain and discomfort   If directed, put ice to the painful area. To do this: ? Put ice in a plastic bag. ? Place a towel between your skin and the bag. ? Leave the ice on for 20 minutes, 2-3 times a day.  Wear a supportive bra, especially during exercise. You may also want to wear a supportive bra while sleeping if your breasts are very tender. Medicines  Take over-the-counter and prescription medicines only as told by your health care provider. If the cause of your pain is infection, you may be prescribed an antibiotic medicine.  If you were prescribed an antibiotic, take it as told by your health care provider. Do not stop taking the antibiotic even if you start to feel better. Eating and drinking  Your health care provider may recommend that you lessen the amount of fat in your diet. You can do this by: ? Limiting fried foods. ? Cooking foods using methods such as baking, boiling, grilling, and broiling.  Decrease the amount of caffeine in your diet. Instead, drink more water and choose caffeine-free drinks. General instructions   Keep a log of the days and times when your breasts are most tender.  Ask your health care provider how to do breast exams at home. This will help you notice if you have an unusual growth or lump.  Keep all follow-up visits as told by your health care provider. This is important. Contact a health care provider if:  Any part of your breast is hard, red, and hot to the touch. This may be a sign of infection.  You are a woman and: ? Not breastfeeding and you have fluid, especially blood  or pus, coming out of your nipples. ? Have a new or painful lump in your breast that remains after your menstrual period ends.  You have a fever.  Your pain does not improve or it gets worse.  Your pain is interfering with your daily   activities. Summary  Breast tenderness may range from mild discomfort to severe pain.  Breast tenderness has many possible causes, including hormone changes, infections, and taking certain medicines.  It can be treated with ice, wearing a supportive bra, and medicines.  Make changes to your diet if told to by your health care provider. This information is not intended to replace advice given to you by your health care provider. Make sure you discuss any questions you have with your health care provider. Document Revised: 11/04/2018 Document Reviewed: 11/04/2018 Elsevier Patient Education  2020 [employer]vier Inc.  

## 2019-12-19 LAB — COMPREHENSIVE METABOLIC PANEL
ALT: 12 IU/L (ref 0–32)
AST: 16 IU/L (ref 0–40)
Albumin/Globulin Ratio: 1.8 (ref 1.2–2.2)
Albumin: 4.1 g/dL (ref 3.8–4.8)
Alkaline Phosphatase: 34 IU/L — ABNORMAL LOW (ref 48–121)
BUN/Creatinine Ratio: 15 (ref 9–23)
BUN: 12 mg/dL (ref 6–24)
Bilirubin Total: 0.4 mg/dL (ref 0.0–1.2)
CO2: 23 mmol/L (ref 20–29)
Calcium: 8.9 mg/dL (ref 8.7–10.2)
Chloride: 106 mmol/L (ref 96–106)
Creatinine, Ser: 0.81 mg/dL (ref 0.57–1.00)
GFR calc Af Amer: 99 mL/min/{1.73_m2} (ref >59)
GFR calc non Af Amer: 86 mL/min/{1.73_m2} (ref >59)
Globulin, Total: 2.3 g/dL (ref 1.5–4.5)
Glucose: 90 mg/dL (ref 65–99)
Potassium: 4.5 mmol/L (ref 3.5–5.2)
Sodium: 140 mmol/L (ref 134–144)
Total Protein: 6.4 g/dL (ref 6.0–8.5)

## 2019-12-19 LAB — CBC
Hematocrit: 37.7 % (ref 34.0–46.6)
Hemoglobin: 12.6 g/dL (ref 11.1–15.9)
MCH: 32.8 pg (ref 26.6–33.0)
MCHC: 33.4 g/dL (ref 31.5–35.7)
MCV: 98 fL — ABNORMAL HIGH (ref 79–97)
Platelets: 224 10*3/uL (ref 150–450)
RBC: 3.84 x10E6/uL (ref 3.77–5.28)
RDW: 12.3 % (ref 11.7–15.4)
WBC: 3.8 10*3/uL (ref 3.4–10.8)

## 2019-12-19 LAB — LIPID PANEL
Chol/HDL Ratio: 2.1 ratio (ref 0.0–4.4)
Cholesterol, Total: 169 mg/dL (ref 100–199)
HDL: 79 mg/dL (ref >39)
LDL Chol Calc (NIH): 78 mg/dL (ref 0–99)
Triglycerides: 64 mg/dL (ref 0–149)
VLDL Cholesterol Cal: 12 mg/dL (ref 5–40)

## 2019-12-24 ENCOUNTER — Other Ambulatory Visit (HOSPITAL_COMMUNITY)
Admission: RE | Admit: 2019-12-24 | Discharge: 2019-12-24 | Disposition: A | Discharge status: Home / Self Care | Payer: 59 | Source: Ambulatory Visit | Attending: Obstetrics and Gynecology | Admitting: Obstetrics and Gynecology

## 2019-12-24 DIAGNOSIS — Z124 Encounter for screening for malignant neoplasm of cervix: Secondary | ICD-10-CM | POA: Insufficient documentation

## 2019-12-24 NOTE — Addendum Note (Signed)
Addended by: Dorothy Spark on: 12/24/2019 02:01 PM   Modules accepted: Orders

## 2020-01-05 ENCOUNTER — Encounter: Payer: Self-pay | Admitting: Obstetrics and Gynecology

## 2020-01-05 LAB — CYTOLOGY - PAP
Comment: NEGATIVE
Diagnosis: UNDETERMINED — AB
High risk HPV: NEGATIVE

## 2020-01-06 ENCOUNTER — Telehealth: Payer: Self-pay | Admitting: Obstetrics and Gynecology

## 2020-01-06 DIAGNOSIS — N644 Mastodynia: Secondary | ICD-10-CM

## 2020-01-06 NOTE — Telephone Encounter (Signed)
Susan Mcdaniel  P [facility] Hi! I still have the tenderness (that feels like a perpetual bruise) in the underneath part of my left breast. I can't feel a single specific lump but I would say it feels lumpy. Can you refer me to have it checked out as we discussed at my visit since it hasn't gotten any better? Thank you so much. ~ Susan Mcdaniel

## 2020-01-06 NOTE — Telephone Encounter (Signed)
Reviewed 12/18/19 AEX notes.   Dr. Talbert Nan -ok to proceed with left breast Dx MMG and [place], if needed, at [facility] for left breast tenderness?

## 2020-01-07 NOTE — Telephone Encounter (Signed)
Call placed to [facility] to schedule Dx MMG and [place] on left breast. Pt scheduled 7/21 at 240 pm.   Call placed to pt. Left detailed message per DPR. Pt made aware of scheduled appt at [facility] and to return call to our office with any questions or concerns.  Routing to Dr Talbert Nan Encounter closed.

## 2020-01-07 NOTE — Telephone Encounter (Signed)
Yes, orders have been signed. Please set up.

## 2020-01-14 ENCOUNTER — Ambulatory Visit
Admission: RE | Admit: 2020-01-14 | Discharge: 2020-01-14 | Disposition: A | Discharge status: Home / Self Care | Payer: 59 | Source: Ambulatory Visit | Attending: Obstetrics and Gynecology | Admitting: Obstetrics and Gynecology

## 2020-01-14 ENCOUNTER — Other Ambulatory Visit: Payer: Self-pay

## 2020-01-14 DIAGNOSIS — N644 Mastodynia: Secondary | ICD-10-CM

## 2020-06-10 ENCOUNTER — Encounter: Payer: Self-pay | Admitting: Obstetrics and Gynecology

## 2020-06-10 ENCOUNTER — Telehealth: Payer: Self-pay

## 2020-06-10 NOTE — Telephone Encounter (Signed)
She can certainly try skipping the last 4 pills and see how she does. She may have breakthrough bleeding

## 2020-06-10 NOTE — Telephone Encounter (Signed)
AEX 11/2019 with Gillermina Hu with pt. Pt states has been taking continuous pill packs of Lo Loestrin for about 1 year now and doing well on it. Pt states noticing mild breast tenderness and PMS when start taking the progestin only (white pills) at the end of pill pack. Pt states has been only skipping the brown placebo/iron pills to skip cycle.  Pt wanting to know if can just take 24 blue pills in pack and skip white and brown pills to help eliminate PMS and mild breast tenderness?    Advised will review with Dr Talbert Nan and return call. Pt agreeable.  Routing to Dr Talbert Nan, please advise

## 2020-06-10 NOTE — Telephone Encounter (Signed)
Pt sent the following mychart message:  Susan, Mcdaniel [facility] Hi! I've been taking my lo loestrin continuously for about a year now and would like to know if I need to take the 2 progestin pills or if I can move right into the next pack when I complete the estrogen ones. I guess my question is - what is the progestin doing for me and do I need it? Thanks!

## 2020-06-11 NOTE — Telephone Encounter (Signed)
Spoke with Susan Mcdaniel. Susan Mcdaniel given update and recommendations per Dr Talbert Nan. Susan Mcdaniel agreeable to try skipping last 4 pills and will return call with any concerns.  Encounter closed

## 2020-12-02 ENCOUNTER — Other Ambulatory Visit: Payer: Self-pay | Admitting: Obstetrics and Gynecology

## 2020-12-02 ENCOUNTER — Other Ambulatory Visit: Payer: Self-pay | Admitting: *Deleted

## 2020-12-02 DIAGNOSIS — Z1231 Encounter for screening mammogram for malignant neoplasm of breast: Secondary | ICD-10-CM

## 2020-12-02 MED ORDER — LO LOESTRIN FE 1 MG-10 MCG / 10 MCG PO TABS: 1.0000 | ORAL_TABLET | Freq: Every day | ORAL | 1 refills | Status: DC

## 2020-12-02 NOTE — Telephone Encounter (Signed)
Patient called has annual exam scheduled on 04/18/21, needs refill on lo Loestrin FE 1/10 mcg tablet. Was scheduled for annual exam at end of this month,however she had to reschedule [place]. Next available was Oct, per patient.

## 2020-12-23 ENCOUNTER — Ambulatory Visit: Payer: 59 | Admitting: Obstetrics and Gynecology

## 2021-01-25 ENCOUNTER — Ambulatory Visit
Admission: RE | Admit: 2021-01-25 | Discharge: 2021-01-25 | Disposition: A | Discharge status: Home / Self Care | Payer: No Typology Code available for payment source | Source: Ambulatory Visit | Attending: Obstetrics and Gynecology | Admitting: Obstetrics and Gynecology

## 2021-01-25 ENCOUNTER — Other Ambulatory Visit: Payer: Self-pay

## 2021-01-25 DIAGNOSIS — Z1231 Encounter for screening mammogram for malignant neoplasm of breast: Secondary | ICD-10-CM

## 2021-02-09 ENCOUNTER — Encounter: Payer: Self-pay | Admitting: Obstetrics and Gynecology

## 2021-02-09 NOTE — Telephone Encounter (Signed)
Please schedule her an appointment

## 2021-02-10 ENCOUNTER — Ambulatory Visit (INDEPENDENT_AMBULATORY_CARE_PROVIDER_SITE_OTHER): Payer: No Typology Code available for payment source | Admitting: Obstetrics and Gynecology

## 2021-02-10 ENCOUNTER — Other Ambulatory Visit: Payer: Self-pay

## 2021-02-10 ENCOUNTER — Encounter: Payer: Self-pay | Admitting: Obstetrics and Gynecology

## 2021-02-10 VITALS — BP 100/68 | HR 64 | Resp 14 | Ht 64.75 in | Wt 113.0 lb

## 2021-02-10 DIAGNOSIS — N760 Acute vaginitis: Secondary | ICD-10-CM

## 2021-02-10 LAB — WET PREP FOR TRICH, YEAST, CLUE

## 2021-02-10 MED ORDER — METRONIDAZOLE 0.75 % VA GEL: 1.0000 | Freq: Every day | VAGINAL | 0 refills | Status: DC

## 2021-02-10 NOTE — Patient Instructions (Signed)

## 2021-02-10 NOTE — Progress Notes (Signed)
GYNECOLOGY  VISIT   HPI: 50 y.o.   Divorced White or Caucasian Not Hispanic or Latino  female   [id number] with No LMP recorded. (Menstrual status: Oral contraceptives).   here for vaginal discharge with odor, symptoms started 2 days ago. No itching, burning, irritation She had one application of clindamycin cream that she used yesterday.   A few years ago she was getting recurrent episodes of BV.   GYNECOLOGIC HISTORY: No LMP recorded. (Menstrual status: Oral contraceptives). Contraception: OCP  Menopausal hormone therapy: none        OB History     Gravida  2   Para  2   Term  2   Preterm      AB      Living  2      SAB      IAB      Ectopic      Multiple      Live Births  2              Patient Active Problem List   Diagnosis Date Noted   Blue nail beds 08/13/2014   SOB (shortness of breath) on exertion 08/13/2014   Dysuria 08/13/2014   BMI less than 19,adult 08/13/2014   Neutropenia ([place]) 11/06/2011   Anemia 11/06/2011   Abnormal cervical cytology 08/08/2011   Preventative health care 08/08/2011    Past Medical History:  Diagnosis Date   Abnormal cervical cytology 08/08/2011   Acne 08/08/2011   Anemia 11/06/2011   Chicken pox 25   History of HPV infection    Hypertension in pregnancy, preeclampsia/eclampsia/prior HTN/postp cond    Mole (skin) 08/08/2011   Neutropenia 11/06/2011   --saw hematologist at [place] in [facility]    Past Surgical History:  Procedure Laterality Date   ABDOMINOPLASTY  09-24-13   Dr Stephanie Coup   CERVICAL BIOPSY  W/ LOOP ELECTRODE EXCISION  Age 72   GYNECOLOGIC CRYOSURGERY  18   leap     SKIN BIOPSY     right lower abdominal wall, benign   TONSILLECTOMY  50 yrs old    Current Outpatient Medications  Medication Sig Dispense Refill   Norethindrone-Ethinyl Estradiol-Fe Biphas (LO LOESTRIN FE) 1 MG-10 MCG / 10 MCG tablet Take 1 tablet by mouth daily. 84 tablet 1   No current facility-administered medications for this  visit.     ALLERGIES: Patient has no known allergies.  Family History  Problem Relation Age of Onset   Hypertension Mother    Transient ischemic attack Mother    Cancer Mother        uterine, tah at 47   Hyperlipidemia Mother    Mental illness Mother    Stroke Mother 39   Cancer Father        lung   Other Father        coronary artery disease   Heart disease Father 42       bypass, triple   Diabetes Father    Hyperlipidemia Father    Hypertension Father    Thyroid disease Father    Mental illness Father    Obesity Sister    Mental illness Sister        bipolar   Drug abuse Sister    Macular degeneration Maternal Grandmother    Blindness Maternal Grandmother    Alzheimer's disease Maternal Grandmother    Cancer Maternal Grandmother        ovarian cancer   Dementia Maternal Grandmother  late onset   Other Maternal Grandfather        stent   Heart disease Maternal Grandfather        stents, MI, pacer for sinus brady   Arthritis Maternal Grandfather    Aneurysm Paternal Grandfather        brain    Social History   Socioeconomic History   Marital status: Divorced    Spouse name: Susan Mcdaniel   Number of children: 2   Years of education: BSN   Highest education level: Not on file  Occupational History   Occupation: Manufacturing engineer  Tobacco Use   Smoking status: Former    Packs/day: 1.00    Years: 15.00    Pack years: 15.00    Types: Cigarettes    Quit date: 06/26/2004    Years since quitting: 16.6   Smokeless tobacco: Never  Vaping Use   Vaping Use: Never used  Substance and Sexual Activity   Alcohol use: Yes    Alcohol/week: 4.0 - 6.0 standard drinks    Types: 4 - 6 Standard drinks or equivalent per week   Drug use: No   Sexual activity: Yes    Partners: Male    Birth control/protection: Inserts    Comment: Nuvaring  Other Topics Concern   Not on file  Social History Narrative   Married to [place]. 2 children Susan Mcdaniel and [place].    BSN-  Programmer, applications.    Drinks caffeine.    Herbal remedies, daily vitamin.   Wears her seatbelt. Smoke detector in the home.    Exercises routinely.    Diet: no beef or pork.    Feels safe in her relationships.    Social Determinants of Health   Financial Resource Strain: Not on file  Food Insecurity: Not on file  Transportation Needs: Not on file  Physical Activity: Not on file  Stress: Not on file  Social Connections: Not on file  Intimate Partner Violence: Not on file    Review of Systems  Genitourinary:        Vaginal discharge with odor for 2-3 days  All other systems reviewed and are negative.  PHYSICAL EXAMINATION:    BP 100/68 (BP Location: Right Arm, Patient Position: Sitting, Cuff Size: Normal)   Pulse 64   Resp 14   Ht 5' 4.75" (1.645 m)   Wt 113 lb (51.3 kg)   BMI 18.95 kg/m     General appearance: alert, cooperative and appears stated age  Pelvic: External genitalia:  no lesions              Urethra:  normal appearing urethra with no masses, tenderness or lesions              Bartholins and Skenes: normal                 Vagina: normal appearing vagina with normal color and discharge, no lesions. There was thick white cream in the vagina c/w clindamycin. No abnormal d/c noted.               Cervix: no lesions               Chaperone was present for exam.  1. Acute vaginitis - WET PREP FOR TRICH, YEAST, CLUE: negative -Artifact seen on the wet prep, concern for partially treated infection Will treat for possible BV, she will return if she has recurrent symptoms.

## 2021-02-26 IMAGING — MG MM DIGITAL DIAGNOSTIC UNILAT*L* W/ TOMO W/ CAD
6 series · 6 of 18 positions shown · non-contrast
Comparison: Previous exam(s).

CLINICAL DATA: Focal pain and tenderness in the lower inner left
breast for the past month.

EXAM:
DIGITAL DIAGNOSTIC LEFT MAMMOGRAM WITH CAD AND TOMO
ULTRASOUND LEFT BREAST

[L MLO synth-2D (1 of 2)]
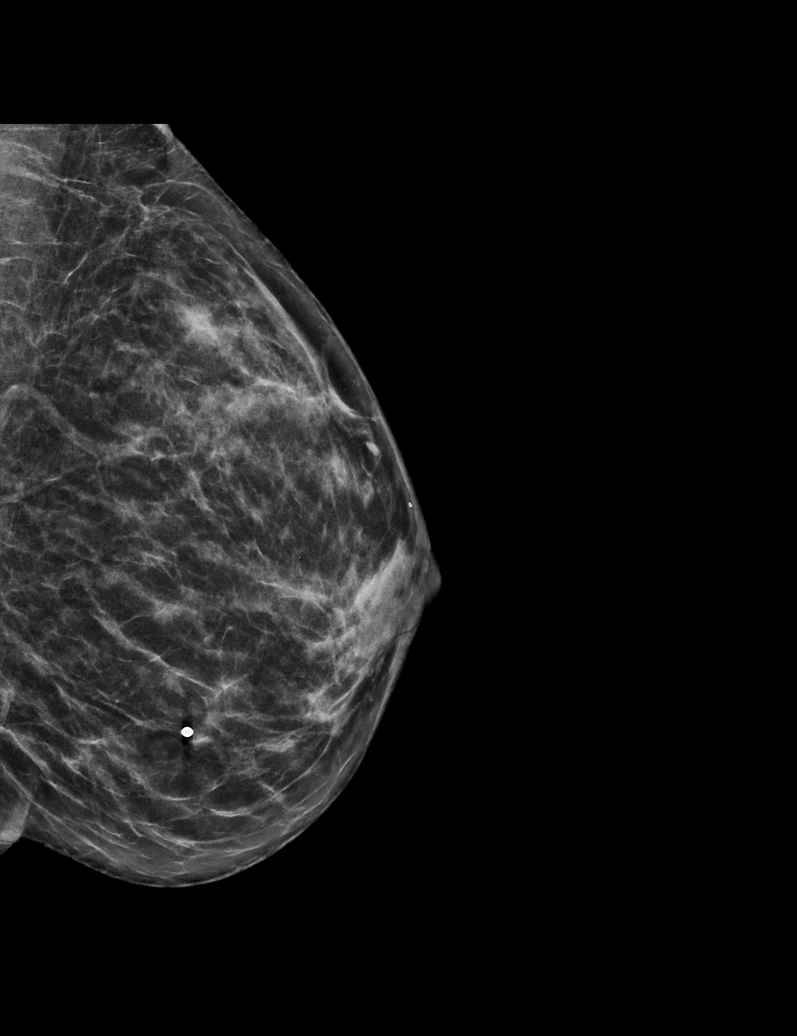

[L MLO synth-2D (2 of 2)]
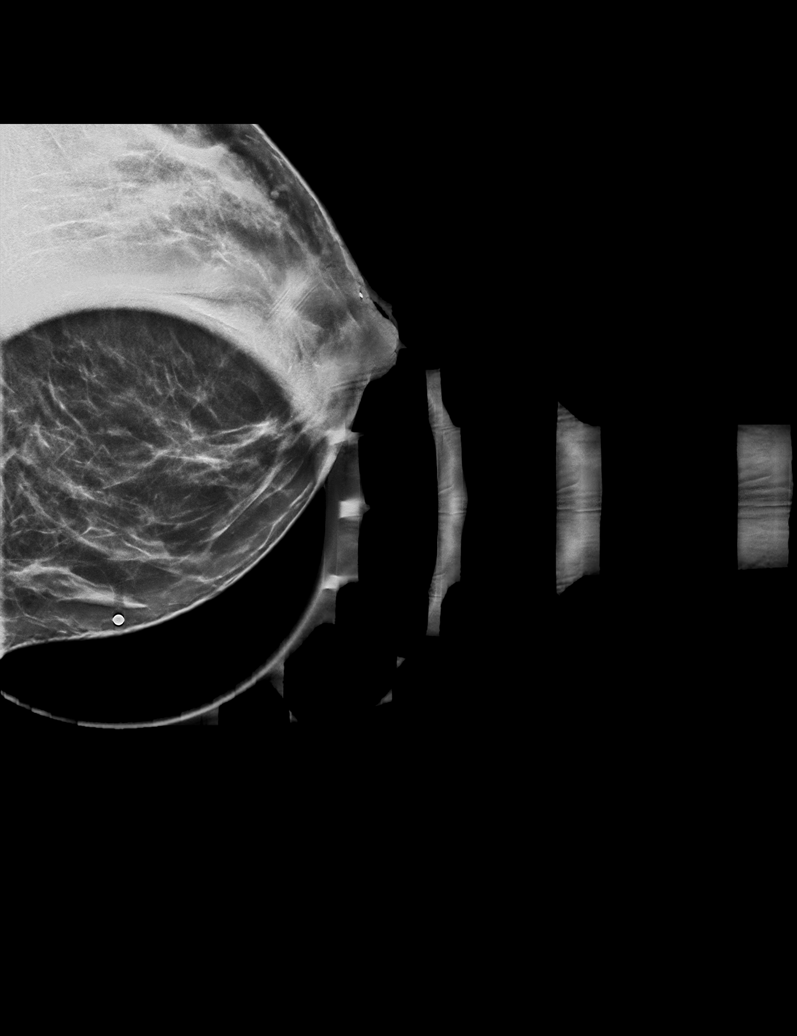

[L CC synth-2D]
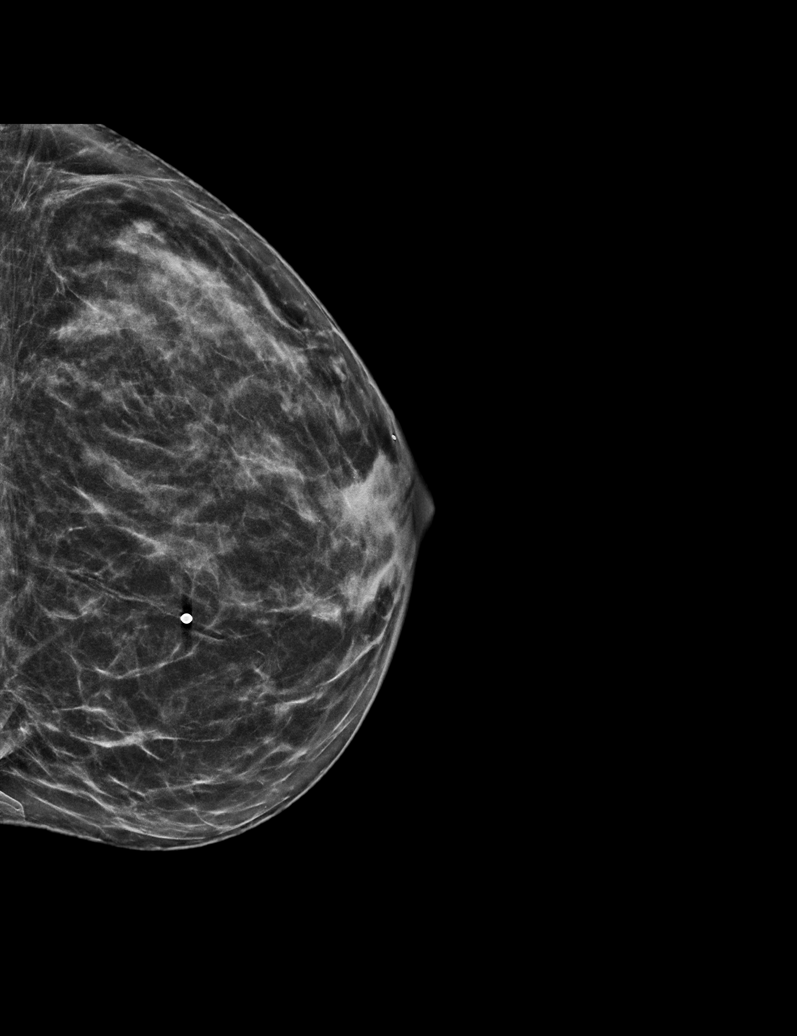

[L MLO tomo (1 of 2) · tomo slice 26/51.0]
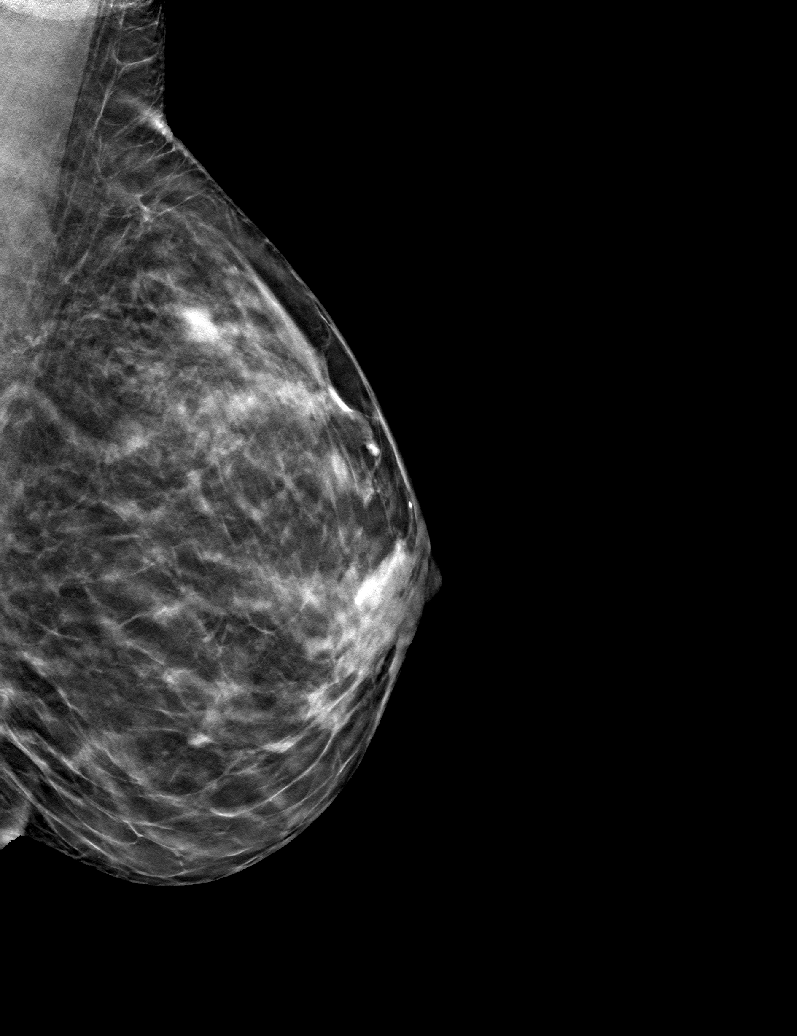

[L MLO tomo (2 of 2) · tomo slice 28/55.0]
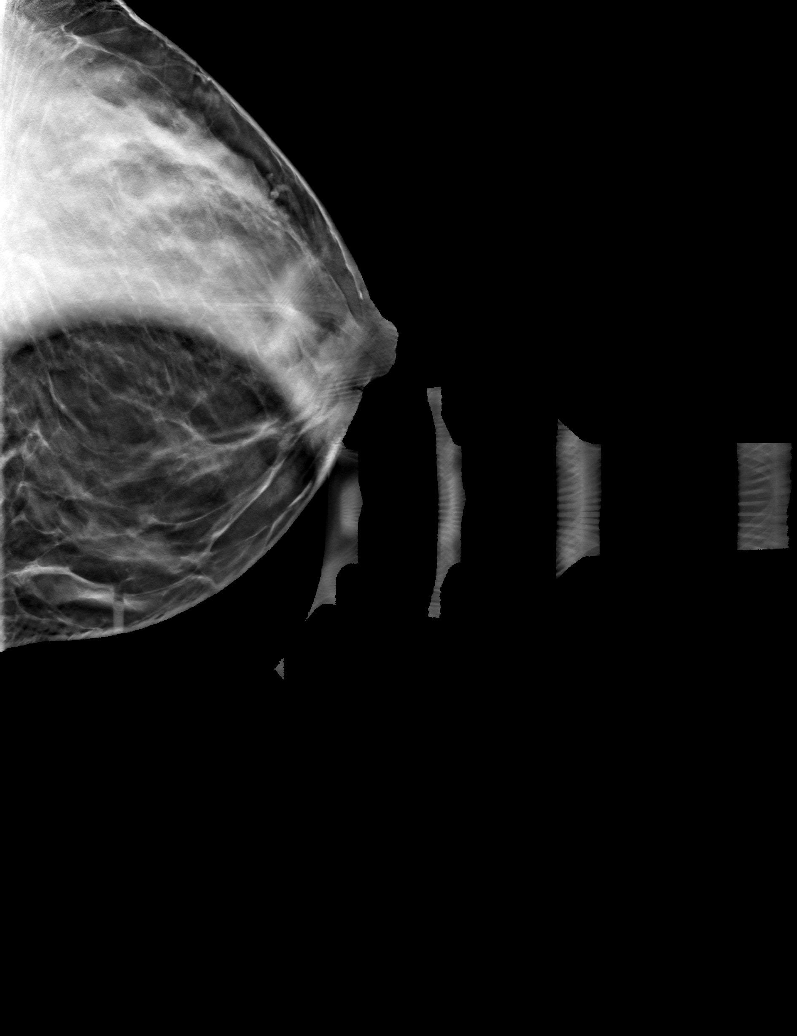

[L CC tomo · tomo slice 25/50.0]
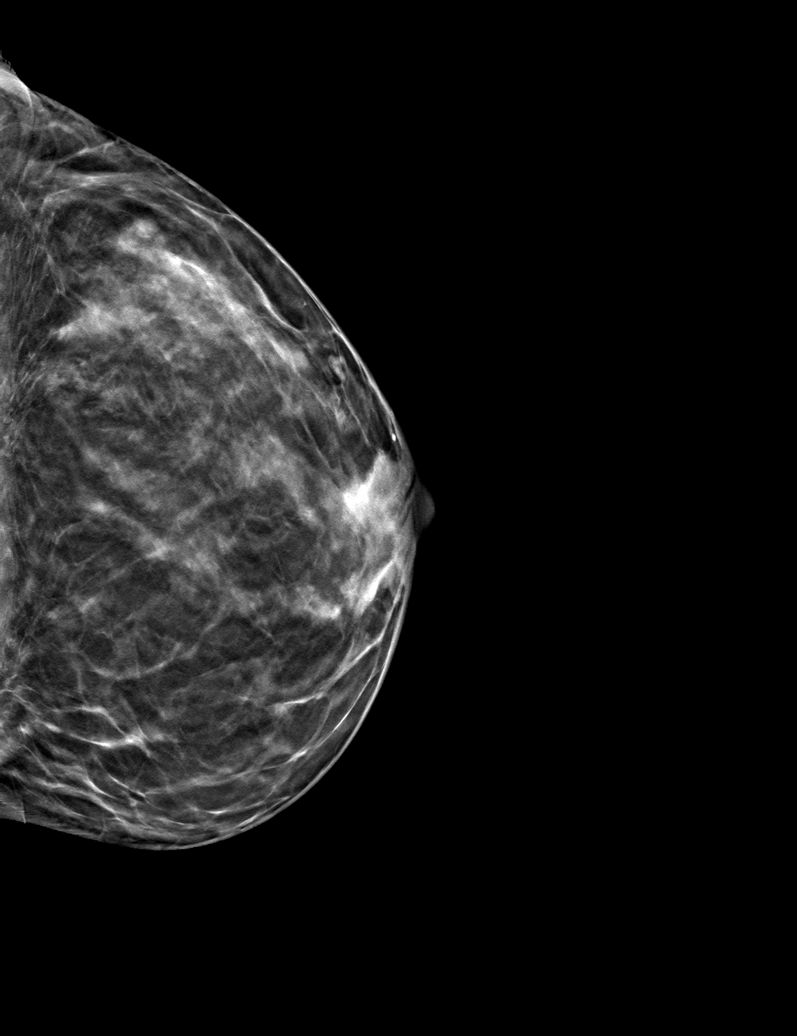

[6 of 18 positions shown; findings below may reference images not displayed]

ACR Breast Density Category c: The breast tissue is heterogeneously
dense, which may obscure small masses.
FINDINGS: Stable mammographic appearance of the left breast with no interval
findings suspicious for malignancy. No abnormality demonstrated at
the location of the focal pain and tenderness, marked with a
metallic marker.

Mammographic images were processed with CAD.

On physical exam, the patient is focally tender to palpation in the
lower inner left breast with no palpable mass.

Targeted ultrasound is performed, showing normal appearing breast
tissue throughout the lower inner left breast in the area of focal
pain and tenderness, including normal dense glandular tissue.
IMPRESSION: No evidence of malignancy.

RECOMMENDATION:
Bilateral screening mammogram in 6 months when due.

I have discussed the findings and recommendations with the patient.
If applicable, a reminder letter will be sent to the patient
regarding the next appointment.

BI-RADS CATEGORY  1: Negative.

## 2021-04-18 ENCOUNTER — Other Ambulatory Visit: Payer: Self-pay

## 2021-04-18 ENCOUNTER — Other Ambulatory Visit (HOSPITAL_COMMUNITY)
Admission: RE | Admit: 2021-04-18 | Discharge: 2021-04-18 | Disposition: A | Discharge status: Home / Self Care | Payer: No Typology Code available for payment source | Source: Ambulatory Visit | Attending: Obstetrics and Gynecology | Admitting: Obstetrics and Gynecology

## 2021-04-18 ENCOUNTER — Ambulatory Visit (INDEPENDENT_AMBULATORY_CARE_PROVIDER_SITE_OTHER): Payer: No Typology Code available for payment source | Admitting: Obstetrics and Gynecology

## 2021-04-18 ENCOUNTER — Encounter: Payer: Self-pay | Admitting: Obstetrics and Gynecology

## 2021-04-18 VITALS — BP 110/72 | HR 72 | Ht 65.0 in | Wt 114.0 lb

## 2021-04-18 DIAGNOSIS — Z8349 Family history of other endocrine, nutritional and metabolic diseases: Secondary | ICD-10-CM

## 2021-04-18 DIAGNOSIS — Z3041 Encounter for surveillance of contraceptive pills: Secondary | ICD-10-CM

## 2021-04-18 DIAGNOSIS — Z124 Encounter for screening for malignant neoplasm of cervix: Secondary | ICD-10-CM

## 2021-04-18 DIAGNOSIS — Z1211 Encounter for screening for malignant neoplasm of colon: Secondary | ICD-10-CM

## 2021-04-18 DIAGNOSIS — Z23 Encounter for immunization: Secondary | ICD-10-CM

## 2021-04-18 DIAGNOSIS — Z0000 Encounter for general adult medical examination without abnormal findings: Secondary | ICD-10-CM

## 2021-04-18 DIAGNOSIS — Z01419 Encounter for gynecological examination (general) (routine) without abnormal findings: Secondary | ICD-10-CM

## 2021-04-18 MED ORDER — LO LOESTRIN FE 1 MG-10 MCG / 10 MCG PO TABS: 1.0000 | ORAL_TABLET | Freq: Every day | ORAL | 3 refills | Status: DC

## 2021-04-18 NOTE — Progress Notes (Signed)
50 y.o. G33P2002 Divorced White or Caucasian Not Hispanic or Latino female here for annual exam. On continuous OCP's, no bleeding. No bowel or bladder issues.  She recently broke up with her boyfriend. Not currently sexually active.    Patient has requested a flu vaccine.   No LMP recorded. (Menstrual status: Oral contraceptives).          Sexually active: No.  The current method of family planning is OCP (estrogen/progesterone).    Exercising: Yes.     Yoga weight training,  Smoker:  no  Health Maintenance: Pap:   08-18-16 WNL, 12-04-14 WNl NEG HR HPV  History of abnormal Pap:  yes hx colposcopy and and LEEP in her 20's MMG:  01/28/21 density B Bi-rads 1 neg  BMD:   none  Colonoscopy: none  TDaP:  08/08/11  Gardasil: na    reports that she quit smoking about 16 years ago. Her smoking use included cigarettes. She has a 15.00 pack-year smoking history. She has never used smokeless tobacco. She reports current alcohol use of about 4.0 - 6.0 standard drinks per week. She reports that she does not use drugs. She works in Manufacturing engineer. Trained as an Therapist, sports.  Daughter is 37, son is 84.   Past Medical History:  Diagnosis Date   Abnormal cervical cytology 08/08/2011   Acne 08/08/2011   Anemia 11/06/2011   Chicken pox 25   History of HPV infection    Hypertension in pregnancy, preeclampsia/eclampsia/prior HTN/postp cond    Mole (skin) 08/08/2011   Neutropenia 11/06/2011   --saw hematologist at [place] in [facility]    Past Surgical History:  Procedure Laterality Date   ABDOMINOPLASTY  09-24-13   Dr Stephanie Coup   CERVICAL BIOPSY  W/ LOOP ELECTRODE EXCISION  Age 5   GYNECOLOGIC CRYOSURGERY  18   leap     SKIN BIOPSY     right lower abdominal wall, benign   TONSILLECTOMY  50 yrs old    Current Outpatient Medications  Medication Sig Dispense Refill   Norethindrone-Ethinyl Estradiol-Fe Biphas (LO LOESTRIN FE) 1 MG-10 MCG / 10 MCG tablet Take 1 tablet by mouth daily. 84 tablet 1   No  current facility-administered medications for this visit.    Family History  Problem Relation Age of Onset   Hypertension Mother    Transient ischemic attack Mother    Cancer Mother        uterine, tah at 49   Hyperlipidemia Mother    Mental illness Mother    Stroke Mother 96   Cancer Father        lung   Other Father        coronary artery disease   Heart disease Father 76       bypass, triple   Diabetes Father    Hyperlipidemia Father    Hypertension Father    Thyroid disease Father    Mental illness Father    Obesity Sister    Mental illness Sister        bipolar   Drug abuse Sister    Macular degeneration Maternal Grandmother    Blindness Maternal Grandmother    Alzheimer's disease Maternal Grandmother    Cancer Maternal Grandmother        ovarian cancer   Dementia Maternal Grandmother        late onset   Other Maternal Grandfather        stent   Heart disease Maternal Grandfather  stents, MI, pacer for sinus brady   Arthritis Maternal Grandfather    Aneurysm Paternal Grandfather        brain    Review of Systems  All other systems reviewed and are negative.  Exam:   BP 110/72   Pulse 72   Ht 5\' 5"  (1.651 m)   Wt 114 lb (51.7 kg)   SpO2 99%   BMI 18.97 kg/m   Weight change: @WEIGHTCHANGE @ Height:   Height: 5\' 5"  (165.1 cm)  Ht Readings from Last 3 Encounters:  04/18/21 5\' 5"  (1.651 m)  02/10/21 5' 4.75" (1.645 m)  12/18/19 5\' 5"  (1.651 m)    General appearance: alert, cooperative and appears stated age Head: Normocephalic, without obvious abnormality, atraumatic Neck: no adenopathy, supple, symmetrical, trachea midline and thyroid normal to inspection and palpation Lungs: clear to auscultation bilaterally Cardiovascular: regular rate and rhythm Breasts: normal appearance, no masses or tenderness Abdomen: soft, non-tender; non distended,  no masses,  no organomegaly Extremities: extremities normal, atraumatic, no cyanosis or edema Skin:  Skin color, texture, turgor normal. No rashes or lesions Lymph nodes: Cervical, supraclavicular, and axillary nodes normal. No abnormal inguinal nodes palpated Neurologic: Grossly normal   Pelvic: External genitalia:  no lesions              Urethra:  normal appearing urethra with no masses, tenderness or lesions              Bartholins and Skenes: normal                 Vagina: normal appearing vagina with normal color and discharge, no lesions              Cervix: no lesions               Bimanual Exam:  Uterus:  normal size, contour, position, consistency, mobility, non-tender              Adnexa: no mass, fullness, tenderness               Rectovaginal: Confirms               Anus:  normal sphincter tone, no lesions  Caryn Bee, CMA chaperoned for the exam.  1. Well woman exam Discussed breast self exam Discussed calcium and vit D intake Mammogram UTD  2. Screening for cervical cancer - Cytology - PAP with HPV  3. Colon cancer screening - Ambulatory referral to Gastroenterology  4. Need for immunization against influenza - Flu Vaccine QUAD 55mo+IM (Fluarix, Fluzone & Alfiuria Quad PF)  5. Family history of vitamin B12 deficiency She desires a vit B12 level - Vitamin B12  6. Laboratory exam ordered as part of routine general medical examination Doesn't need lipid panel this year - CBC - Comprehensive metabolic panel  7. Encounter for surveillance of contraceptive pills - Norethindrone-Ethinyl Estradiol-Fe Biphas (LO LOESTRIN FE) 1 MG-10 MCG / 10 MCG tablet; Take 1 tablet by mouth daily. Skip the placebo pills and take pills continuously.  Dispense: 112 tablet; Refill: 3

## 2021-04-18 NOTE — Patient Instructions (Signed)
EXERCISE   We recommended that you start or continue a regular exercise program for good health. Physical activity is anything that gets your body moving, some is better than none. The CDC recommends 150 minutes per week of Moderate-Intensity Aerobic Activity and 2 or more days of Muscle Strengthening Activity.  Benefits of exercise are limitless: helps weight loss/weight maintenance, improves mood and energy, helps with depression and anxiety, improves sleep, tones and strengthens muscles, improves balance, improves bone density, protects from chronic conditions such as heart disease, high blood pressure and diabetes and so much more. To learn more visit: https://www.cdc.gov/physicalactivity/index.html  DIET: Good nutrition starts with a healthy diet of fruits, vegetables, whole grains, and lean protein sources. Drink plenty of water for hydration. Minimize empty calories, sodium, sweets. For more information about dietary recommendations visit: https://health.gov/our-work/nutrition-physical-activity/dietary-guidelines and https://www.myplate.gov/  ALCOHOL:  Women should limit their alcohol intake to no more than 7 drinks/beers/glasses of wine (combined, not each!) per week. Moderation of alcohol intake to this level decreases your risk of breast cancer and liver damage.  If you are concerned that you may have a problem, or your friends have told you they are concerned about your drinking, there are many resources to help. A well-known program that is free, effective, and available to all people all over the nation is Alcoholics Anonymous.  Check out this site to learn more: https://www.aa.org/   CALCIUM AND VITAMIN D:  Adequate intake of calcium and Vitamin D are recommended for bone health.  You should be getting between 1000-1200 mg of calcium and 800 units of Vitamin D daily between diet and supplements  PAP SMEARS:  Pap smears, to check for cervical cancer or precancers,  have traditionally been  done yearly, scientific advances have shown that most women can have pap smears less often.  However, every woman still should have a physical exam from her gynecologist every year. It will include a breast check, inspection of the vulva and vagina to check for abnormal growths or skin changes, a visual exam of the cervix, and then an exam to evaluate the size and shape of the uterus and ovaries. We will also provide age appropriate advice regarding health maintenance, like when you should have certain vaccines, screening for sexually transmitted diseases, bone density testing, colonoscopy, mammograms, etc.   MAMMOGRAMS:  All women over 40 years old should have a routine mammogram.   COLON CANCER SCREENING: Now recommend starting at age 45. At this time colonoscopy is not covered for routine screening until 50. There are take home tests that can be done between 45-49.   COLONOSCOPY:  Colonoscopy to screen for colon cancer is recommended for all women at age 50.  We know, you hate the idea of the prep.  We agree, BUT, having colon cancer and not knowing it is worse!!  Colon cancer so often starts as a polyp that can be seen and removed at colonscopy, which can quite literally save your life!  And if your first colonoscopy is normal and you have no family history of colon cancer, most women don't have to have it again for 10 years.  Once every ten years, you can do something that may end up saving your life, right?  We will be happy to help you get it scheduled when you are ready.  Be sure to check your insurance coverage so you understand how much it will cost.  It may be covered as a preventative service at no cost, but you should check   your particular policy.      Breast Self-Awareness Breast self-awareness means being familiar with how your breasts look and feel. It involves checking your breasts regularly and reporting any changes to your health care provider. Practicing breast self-awareness is  important. A change in your breasts can be a sign of a serious medical problem. Being familiar with how your breasts look and feel allows you to find any problems early, when treatment is more likely to be successful. All women should practice breast self-awareness, including women who have had breast implants. How to do a breast self-exam One way to learn what is normal for your breasts and whether your breasts are changing is to do a breast self-exam. To do a breast self-exam: Look for Changes  Remove all the clothing above your waist. Stand in front of a mirror in a room with good lighting. Put your hands on your hips. Push your hands firmly downward. Compare your breasts in the mirror. Look for differences between them (asymmetry), such as: Differences in shape. Differences in size. Puckers, dips, and bumps in one breast and not the other. Look at each breast for changes in your skin, such as: Redness. Scaly areas. Look for changes in your nipples, such as: Discharge. Bleeding. Dimpling. Redness. A change in position. Feel for Changes Carefully feel your breasts for lumps and changes. It is best to do this while lying on your back on the floor and again while sitting or standing in the shower or tub with soapy water on your skin. Feel each breast in the following way: Place the arm on the side of the breast you are examining above your head. Feel your breast with the other hand. Start in the nipple area and make  inch (2 cm) overlapping circles to feel your breast. Use the pads of your three middle fingers to do this. Apply light pressure, then medium pressure, then firm pressure. The light pressure will allow you to feel the tissue closest to the skin. The medium pressure will allow you to feel the tissue that is a little deeper. The firm pressure will allow you to feel the tissue close to the ribs. Continue the overlapping circles, moving downward over the breast until you feel your  ribs below your breast. Move one finger-width toward the center of the body. Continue to use the  inch (2 cm) overlapping circles to feel your breast as you move slowly up toward your collarbone. Continue the up and down exam using all three pressures until you reach your armpit.  Write Down What You Find  Write down what is normal for each breast and any changes that you find. Keep a written record with breast changes or normal findings for each breast. By writing this information down, you do not need to depend only on memory for size, tenderness, or location. Write down where you are in your menstrual cycle, if you are still menstruating. If you are having trouble noticing differences in your breasts, do not get discouraged. With time you will become more familiar with the variations in your breasts and more comfortable with the exam. How often should I examine my breasts? Examine your breasts every month. If you are breastfeeding, the best time to examine your breasts is after a feeding or after using a breast pump. If you menstruate, the best time to examine your breasts is 5-7 days after your period is over. During your period, your breasts are lumpier, and it may be more   difficult to notice changes. When should I see my health care provider? See your health care provider if you notice: A change in shape or size of your breasts or nipples. A change in the skin of your breast or nipples, such as a reddened or scaly area. Unusual discharge from your nipples. A lump or thick area that was not there before. Pain in your breasts. Anything that concerns you.  Kegel Exercises Kegel exercises can help strengthen your pelvic floor muscles. The pelvic floor is a group of muscles that support your rectum, small intestine, and bladder. In females, pelvic floor muscles also help support the womb (uterus). These muscles help you control the flow of urine and stool. Kegel exercises are painless and  simple, and they do not require any equipment. Your provider may suggest Kegel exercises to: Improve bladder and bowel control. Improve sexual response. Improve weak pelvic floor muscles after surgery to remove the uterus (hysterectomy) or pregnancy (females). Improve weak pelvic floor muscles after prostate gland removal or surgery (males). Kegel exercises involve squeezing your pelvic floor muscles, which are the same muscles you squeeze when you try to stop the flow of urine or keep from passing gas. The exercises can be done while sitting, standing, or lying down, but it is best to vary your position. Exercises How to do Kegel exercises: Squeeze your pelvic floor muscles tight. You should feel a tight lift in your rectal area. If you are a female, you should also feel a tightness in your vaginal area. Keep your stomach, buttocks, and legs relaxed. Hold the muscles tight for up to 10 seconds. Breathe normally. Relax your muscles. Repeat as told by your health care provider. Repeat this exercise daily as told by your health care provider. Continue to do this exercise for at least 4-6 weeks, or for as long as told by your health care provider. You may be referred to a physical therapist who can help you learn more about how to do Kegel exercises. Depending on your condition, your health care provider may recommend: Varying how long you squeeze your muscles. Doing several sets of exercises every day. Doing exercises for several weeks. Making Kegel exercises a part of your regular exercise routine. This information is not intended to replace advice given to you by your health care provider. Make sure you discuss any questions you have with your health care provider. Document Revised: 06/02/2020 Document Reviewed: 01/30/2018 Elsevier Patient Education  [employer].

## 2021-04-19 LAB — COMPREHENSIVE METABOLIC PANEL
AG Ratio: 1.9 (calc) (ref 1.0–2.5)
ALT: 12 U/L (ref 6–29)
AST: 21 U/L (ref 10–35)
Albumin: 4.6 g/dL (ref 3.6–5.1)
Alkaline phosphatase (APISO): 30 U/L — ABNORMAL LOW (ref 37–153)
BUN: 13 mg/dL (ref 7–25)
CO2: 24 mmol/L (ref 20–32)
Calcium: 9.6 mg/dL (ref 8.6–10.4)
Chloride: 105 mmol/L (ref 98–110)
Creat: 0.78 mg/dL (ref 0.50–1.03)
Globulin: 2.4 g/dL (calc) (ref 1.9–3.7)
Glucose, Bld: 98 mg/dL (ref 65–99)
Potassium: 4.6 mmol/L (ref 3.5–5.3)
Sodium: 139 mmol/L (ref 135–146)
Total Bilirubin: 0.5 mg/dL (ref 0.2–1.2)
Total Protein: 7 g/dL (ref 6.1–8.1)

## 2021-04-19 LAB — CBC
HCT: 37.2 % (ref 35.0–45.0)
Hemoglobin: 12.7 g/dL (ref 11.7–15.5)
MCH: 32.2 pg (ref 27.0–33.0)
MCHC: 34.1 g/dL (ref 32.0–36.0)
MCV: 94.4 fL (ref 80.0–100.0)
MPV: 11 fL (ref 7.5–12.5)
Platelets: 243 10*3/uL (ref 140–400)
RBC: 3.94 10*6/uL (ref 3.80–5.10)
RDW: 12.1 % (ref 11.0–15.0)
WBC: 6.3 10*3/uL (ref 3.8–10.8)

## 2021-04-19 LAB — VITAMIN B12: Vitamin B-12: 182 pg/mL — ABNORMAL LOW (ref 200–1100)

## 2021-04-22 LAB — CYTOLOGY - PAP
Comment: NEGATIVE
Diagnosis: NEGATIVE
High risk HPV: NEGATIVE

## 2021-07-14 ENCOUNTER — Ambulatory Visit: Payer: No Typology Code available for payment source | Admitting: Nurse Practitioner

## 2021-07-14 ENCOUNTER — Encounter: Payer: Self-pay | Admitting: Nurse Practitioner

## 2021-07-14 ENCOUNTER — Other Ambulatory Visit: Payer: Self-pay

## 2021-07-14 VITALS — BP 110/70 | HR 77 | Temp 98.4°F | Ht 65.0 in | Wt 113.6 lb

## 2021-07-14 DIAGNOSIS — Z1329 Encounter for screening for other suspected endocrine disorder: Secondary | ICD-10-CM

## 2021-07-14 DIAGNOSIS — E538 Deficiency of other specified B group vitamins: Secondary | ICD-10-CM

## 2021-07-14 DIAGNOSIS — Z13220 Encounter for screening for lipoid disorders: Secondary | ICD-10-CM | POA: Diagnosis not present

## 2021-07-14 DIAGNOSIS — Z01812 Encounter for preprocedural laboratory examination: Secondary | ICD-10-CM | POA: Diagnosis not present

## 2021-07-14 DIAGNOSIS — Z131 Encounter for screening for diabetes mellitus: Secondary | ICD-10-CM

## 2021-07-14 DIAGNOSIS — G4700 Insomnia, unspecified: Secondary | ICD-10-CM | POA: Diagnosis not present

## 2021-07-14 DIAGNOSIS — Z419 Encounter for procedure for purposes other than remedying health state, unspecified: Secondary | ICD-10-CM | POA: Diagnosis not present

## 2021-07-14 LAB — VITAMIN B12: Vitamin B-12: 427 pg/mL (ref 211–911)

## 2021-07-14 LAB — COMPREHENSIVE METABOLIC PANEL
ALT: 13 U/L (ref 0–35)
AST: 21 U/L (ref 0–37)
Albumin: 4.4 g/dL (ref 3.5–5.2)
Alkaline Phosphatase: 30 U/L — ABNORMAL LOW (ref 39–117)
BUN: 14 mg/dL (ref 6–23)
CO2: 25 mEq/L (ref 19–32)
Calcium: 9.2 mg/dL (ref 8.4–10.5)
Chloride: 105 mEq/L (ref 96–112)
Creatinine, Ser: 0.79 mg/dL (ref 0.40–1.20)
GFR: 87.17 mL/min (ref >60.00)
Glucose, Bld: 101 mg/dL — ABNORMAL HIGH (ref 70–99)
Potassium: 4.4 mEq/L (ref 3.5–5.1)
Sodium: 137 mEq/L (ref 135–145)
Total Bilirubin: 0.4 mg/dL (ref 0.2–1.2)
Total Protein: 7.1 g/dL (ref 6.0–8.3)

## 2021-07-14 LAB — LIPID PANEL
Cholesterol: 178 mg/dL (ref 0–200)
HDL: 81.1 mg/dL (ref >39.00)
LDL Cholesterol: 77 mg/dL (ref 0–99)
NonHDL: 96.92
Total CHOL/HDL Ratio: 2
Triglycerides: 100 mg/dL (ref 0.0–149.0)
VLDL: 20 mg/dL (ref 0.0–40.0)

## 2021-07-14 LAB — CBC WITH DIFFERENTIAL/PLATELET
Basophils Absolute: 0 10*3/uL (ref 0.0–0.1)
Basophils Relative: 1 % (ref 0.0–3.0)
Eosinophils Absolute: 0.1 10*3/uL (ref 0.0–0.7)
Eosinophils Relative: 2.7 % (ref 0.0–5.0)
HCT: 38.5 % (ref 36.0–46.0)
Hemoglobin: 12.8 g/dL (ref 12.0–15.0)
Lymphocytes Relative: 34.4 % (ref 12.0–46.0)
Lymphs Abs: 1.5 10*3/uL (ref 0.7–4.0)
MCHC: 33.2 g/dL (ref 30.0–36.0)
MCV: 96 fl (ref 78.0–100.0)
Monocytes Absolute: 0.2 10*3/uL (ref 0.1–1.0)
Monocytes Relative: 5.1 % (ref 3.0–12.0)
Neutro Abs: 2.5 10*3/uL (ref 1.4–7.7)
Neutrophils Relative %: 56.8 % (ref 43.0–77.0)
Platelets: 213 10*3/uL (ref 150.0–400.0)
RBC: 4 Mil/uL (ref 3.87–5.11)
RDW: 13 % (ref 11.5–15.5)
WBC: 4.5 10*3/uL (ref 4.0–10.5)

## 2021-07-14 LAB — TSH: TSH: 1.47 u[IU]/mL (ref 0.35–5.50)

## 2021-07-14 LAB — APTT: aPTT: 25.3 s (ref 23.4–32.7)

## 2021-07-14 LAB — PROTIME-INR
INR: 0.9 ratio (ref 0.8–1.0)
Prothrombin Time: 10.5 s (ref 9.6–13.1)

## 2021-07-14 MED ORDER — HYDROXYZINE HCL 50 MG PO TABS: 25.0000 mg | ORAL_TABLET | Freq: Every evening | ORAL | 2 refills | Status: DC | PRN

## 2021-07-14 NOTE — Addendum Note (Signed)
Addended by: Shirlyn Goltz on: 07/14/2021 03:47 PM   Modules accepted: Orders

## 2021-07-14 NOTE — Progress Notes (Signed)
Subjective:  Patient ID: Susan Mcdaniel, female    DOB: November 30, 1970  Age: 51 y.o. MRN: [id number]  CC:  Chief Complaint  Patient presents with   New Patient (Initial Visit)      HPI  This patient arrives today for the above.  She does need some specific labs and EKG in preparation for upcoming surgical  breast mastopexy.  She has been seeing her OB/GYN routinely for healthcare but was encouraged by OB to start seeing a primary care provider so she is here today to establish care.  She does mention she has difficulty sleeping intermittently and would like to discuss medication for this.  She would like to try Xanax if possible, but is open to other options.  She does take melatonin over-the-counter which will work sometimes but does not seem to provide enough assistance with her insomnia.  Past Medical History:  Diagnosis Date   Abnormal cervical cytology 08/08/2011   Acne 08/08/2011   Anemia 11/06/2011   Chicken pox 25   History of HPV infection    Hypertension in pregnancy, preeclampsia/eclampsia/prior HTN/postp cond    Mole (skin) 08/08/2011   Neutropenia 11/06/2011   --saw hematologist at [facility] in [place]      Family History  Problem Relation Age of Onset   Hypertension Mother    Transient ischemic attack Mother    Cancer Mother        uterine, tah at 8   Hyperlipidemia Mother    Mental illness Mother    Stroke Mother 68   Cancer Father        lung   Other Father        coronary artery disease   Heart disease Father 54       bypass, triple   Diabetes Father    Hyperlipidemia Father    Hypertension Father    Thyroid disease Father    Mental illness Father    Obesity Sister    Mental illness Sister        bipolar   Drug abuse Sister    Macular degeneration Maternal Grandmother    Blindness Maternal Grandmother    Alzheimer's disease Maternal Grandmother    Cancer Maternal Grandmother        ovarian cancer   Dementia Maternal Grandmother         late onset   Other Maternal Grandfather        stent   Heart disease Maternal Grandfather        stents, MI, pacer for sinus brady   Arthritis Maternal Grandfather    Aneurysm Paternal Grandfather        brain    Social History   Social History Narrative   Married to [place]. 2 children Gwyndolyn Saxon and [place].    BSN- Programmer, applications.    Drinks caffeine.    Herbal remedies, daily vitamin.   Wears her seatbelt. Smoke detector in the home.    Exercises routinely.    Diet: no beef or pork.    Feels safe in her relationships.    Social History   Tobacco Use   Smoking status: Former    Packs/day: 1.00    Years: 15.00    Pack years: 15.00    Types: Cigarettes    Quit date: 06/26/2004    Years since quitting: 17.0   Smokeless tobacco: Never  Substance Use Topics   Alcohol use: Yes    Alcohol/week: 4.0 - 6.0 standard drinks  Types: 4 - 6 Standard drinks or equivalent per week     Current Meds  Medication Sig   hydrOXYzine (ATARAX) 50 MG tablet Take 0.5-1 tablets (25-50 mg total) by mouth at bedtime as needed.   Norethindrone-Ethinyl Estradiol-Fe Biphas (LO LOESTRIN FE) 1 MG-10 MCG / 10 MCG tablet Take 1 tablet by mouth daily. Skip the placebo pills and take pills continuously.    ROS:  Review of Systems  Constitutional:  Negative for fever and weight loss.  Respiratory:  Negative for shortness of breath.   Cardiovascular:  Negative for chest pain.  Neurological:  Negative for loss of consciousness.    Objective:   Today's Vitals: BP 110/70 (BP Location: Left Arm, Patient Position: Sitting, Cuff Size: Small)    Pulse 77    Temp 98.4 F (36.9 C) (Oral)    Ht 5\' 5"  (1.651 m)    Wt 113 lb 9.6 oz (51.5 kg)    SpO2 99%    BMI 18.90 kg/m  Vitals with BMI 07/14/2021 04/18/2021 02/10/2021  Height 5\' 5"  5\' 5"  5' 4.75"  Weight 113 lbs 10 oz 114 lbs 113 lbs  BMI 18.9 78.46 96.29  Systolic 528 413 244  Diastolic 70 72 68  Pulse 77 72 64     Physical Exam Vitals reviewed.   Constitutional:      General: She is not in acute distress.    Appearance: Normal appearance.  HENT:     Head: Normocephalic and atraumatic.  Neck:     Vascular: No carotid bruit.  Cardiovascular:     Rate and Rhythm: Normal rate and regular rhythm.     Pulses: Normal pulses.     Heart sounds: Normal heart sounds.  Pulmonary:     Effort: Pulmonary effort is normal.     Breath sounds: Normal breath sounds.  Skin:    General: Skin is warm and dry.  Neurological:     General: No focal deficit present.     Mental Status: She is alert and oriented to person, place, and time.  Psychiatric:        Mood and Affect: Mood normal.        Behavior: Behavior normal.        Judgment: Judgment normal.         Assessment and Plan   1. Surgery, elective   2. Thyroid disorder screen   3. Screening for diabetes mellitus   4. Screening, lipid   5. Low vitamin B12 level   6. Insomnia, unspecified type   7. Pre-operative laboratory examination      Plan: 1., 7.  We will collect blood work as well as EKG as requested by Nurse, adult.  We will then send off results to surgeon once received. 2.-5.  We will collect blood work for further evaluation. 6.  Per shared decision making we will trial hydroxyzine as needed for insomnia.  If this does not help may consider alternative.   Tests ordered Orders Placed This Encounter  Procedures   TSH   Hemoglobin A1c   Lipid panel   B12   CBC with Differential/Platelet   Comprehensive metabolic panel   Protime-INR   PTT   EKG 12-Lead      Meds ordered this encounter  Medications   hydrOXYzine (ATARAX) 50 MG tablet    Sig: Take 0.5-1 tablets (25-50 mg total) by mouth at bedtime as needed.    Dispense:  30 tablet    Refill:  2  Order Specific Question:   Supervising Provider    Answer:   Binnie Rail [[id number]]    Patient to follow-up in approximately 1 month for comprehensive physical exam, or sooner as needed.  Ailene Ards, NP

## 2021-07-15 ENCOUNTER — Encounter: Payer: Self-pay | Admitting: Nurse Practitioner

## 2021-07-15 LAB — HEMOGLOBIN A1C: Hgb A1c MFr Bld: 5.3 % (ref 4.6–6.5)

## 2021-07-21 ENCOUNTER — Telehealth: Payer: Self-pay | Admitting: Nurse Practitioner

## 2021-07-21 NOTE — Telephone Encounter (Signed)
This patient needs [place] to send lab results to her surgeon. Do you know if she has signed a ROI form that will allow [place] to send the labs? If not, we need her to sign one. If she has signed one, then I have the documents ready to go to be faxed to surgeon.

## 2021-07-28 ENCOUNTER — Encounter: Payer: Self-pay | Admitting: Nurse Practitioner

## 2021-08-08 NOTE — Telephone Encounter (Signed)
EKG was faxed to [phone] and Confirmation was received. Also confirmed with Kimberly Martinique that it was received. Emailed EKG to [email or web address] bc Joelene Millin didn't receive it through [employer].  FYI

## 2021-08-18 ENCOUNTER — Encounter: Payer: Self-pay | Admitting: Nurse Practitioner

## 2021-08-18 ENCOUNTER — Other Ambulatory Visit: Payer: Self-pay

## 2021-08-18 ENCOUNTER — Ambulatory Visit (INDEPENDENT_AMBULATORY_CARE_PROVIDER_SITE_OTHER): Payer: No Typology Code available for payment source | Admitting: Nurse Practitioner

## 2021-08-18 VITALS — BP 110/62 | HR 70 | Temp 98.3°F | Ht 65.0 in | Wt 114.6 lb

## 2021-08-18 DIAGNOSIS — Z0001 Encounter for general adult medical examination with abnormal findings: Secondary | ICD-10-CM | POA: Diagnosis not present

## 2021-08-18 DIAGNOSIS — G4700 Insomnia, unspecified: Secondary | ICD-10-CM | POA: Diagnosis not present

## 2021-08-18 NOTE — Progress Notes (Signed)
Subjective:  Patient ID: Susan Mcdaniel, female    DOB: 1971/05/04  Age: 51 y.o. MRN: [id number]  CC:  Chief Complaint  Patient presents with   Follow-up    No concerns      HPI  This patient arrives today for the above.  Patient is here for annual physical exam with no concerns.  She tells me she has colonoscopy scheduled around May.  She has had 5 COVID-19 vaccines but is not sure of the exact date.  She has had one of her shingles vaccines but is due for the second 1.  She believes she is up-to-date with tetanus but is not exactly sure of the date of her last tetanus shot.  She is undergoing plastic surgery tomorrow.  She is up-to-date with mammogram and Pap smear.  She had blood work done at previous office visit which showed mildly reduced GFR 87.17, and glucose of 101.  A1c was normal at 5.3.  No anemia was noted.  No hyperlipidemia noted either.  She was prescribed hydroxyzine to take as needed for sleep at last office visit.  She tells me that it does help her with sleeping and does not cause her to feel drowsy the next day.  Past Medical History:  Diagnosis Date   Abnormal cervical cytology 08/08/2011   Acne 08/08/2011   Anemia 11/06/2011   Chicken pox 25   History of HPV infection    Hypertension in pregnancy, preeclampsia/eclampsia/prior HTN/postp cond    Mole (skin) 08/08/2011   Neutropenia 11/06/2011   --saw hematologist at [facility] in [place]      Family History  Problem Relation Age of Onset   Hypertension Mother    Transient ischemic attack Mother    Cancer Mother        uterine, tah at 72   Hyperlipidemia Mother    Mental illness Mother    Stroke Mother 24   Cancer Father        lung   Other Father        coronary artery disease   Heart disease Father 72       bypass, triple   Diabetes Father    Hyperlipidemia Father    Hypertension Father    Thyroid disease Father    Mental illness Father    Obesity Sister    Mental illness Sister         bipolar   Drug abuse Sister    Macular degeneration Maternal Grandmother    Blindness Maternal Grandmother    Alzheimer's disease Maternal Grandmother    Cancer Maternal Grandmother        ovarian cancer   Dementia Maternal Grandmother        late onset   Other Maternal Grandfather        stent   Heart disease Maternal Grandfather        stents, MI, pacer for sinus brady   Arthritis Maternal Grandfather    Aneurysm Paternal Grandfather        brain    Social History   Social History Narrative   Married to [place]. 2 children Gwyndolyn Saxon and [place].    BSN- Programmer, applications.    Drinks caffeine.    Herbal remedies, daily vitamin.   Wears her seatbelt. Smoke detector in the home.    Exercises routinely.    Diet: no beef or pork.    Feels safe in her relationships.    Social History  Tobacco Use   Smoking status: Former    Packs/day: 1.00    Years: 15.00    Pack years: 15.00    Types: Cigarettes    Quit date: 06/26/2004    Years since quitting: 17.1   Smokeless tobacco: Never  Substance Use Topics   Alcohol use: Yes    Alcohol/week: 4.0 - 6.0 standard drinks    Types: 4 - 6 Standard drinks or equivalent per week     Current Meds  Medication Sig   hydrOXYzine (ATARAX) 50 MG tablet Take 0.5-1 tablets (25-50 mg total) by mouth at bedtime as needed.   Norethindrone-Ethinyl Estradiol-Fe Biphas (LO LOESTRIN FE) 1 MG-10 MCG / 10 MCG tablet Take 1 tablet by mouth daily. Skip the placebo pills and take pills continuously.    ROS:  Review of Systems  Constitutional:  Negative for fever, malaise/fatigue and weight loss.  Eyes:  Negative for blurred vision and double vision.  Respiratory:  Negative for cough, shortness of breath and wheezing.   Cardiovascular:  Negative for chest pain and palpitations.  Genitourinary:  Negative for dysuria and hematuria.  Neurological:  Negative for dizziness and headaches.    Objective:   Today's Vitals: BP 110/62 (BP Location: Left Arm,  Patient Position: Sitting, Cuff Size: Normal)    Pulse 70    Temp 98.3 F (36.8 C) (Oral)    Ht 5\' 5"  (1.651 m)    Wt 114 lb 9.6 oz (52 kg)    SpO2 99%    BMI 19.07 kg/m  Vitals with BMI 08/18/2021 07/14/2021 04/18/2021  Height 5\' 5"  5\' 5"  5\' 5"   Weight 114 lbs 10 oz 113 lbs 10 oz 114 lbs  BMI 19.07 71.6 96.78  Systolic 938 101 751  Diastolic 62 70 72  Pulse 70 77 72     Physical Exam Vitals reviewed.  Constitutional:      Appearance: Normal appearance.  HENT:     Head: Normocephalic and atraumatic.     Right Ear: Tympanic membrane, ear canal and external ear normal.     Left Ear: Ear canal and external ear normal. Tympanic membrane is scarred.  Eyes:     General:        Right eye: No discharge.        Left eye: No discharge.     Extraocular Movements: Extraocular movements intact.     Conjunctiva/sclera: Conjunctivae normal.     Pupils: Pupils are equal, round, and reactive to light.  Neck:     Vascular: No carotid bruit.  Cardiovascular:     Rate and Rhythm: Normal rate and regular rhythm.     Pulses: Normal pulses.     Heart sounds: Normal heart sounds. No murmur heard. Pulmonary:     Effort: Pulmonary effort is normal.     Breath sounds: Normal breath sounds.  Chest:     Comments: Deferred per patient preference Abdominal:     General: Abdomen is flat. Bowel sounds are normal. There is no distension.     Palpations: Abdomen is soft. There is no mass.     Tenderness: There is no abdominal tenderness.  Musculoskeletal:        General: No tenderness.     Cervical back: Neck supple. No muscular tenderness.     Right lower leg: No edema.     Left lower leg: No edema.  Lymphadenopathy:     Cervical: No cervical adenopathy.     Upper Body:     Right  upper body: No supraclavicular adenopathy.     Left upper body: No supraclavicular adenopathy.  Skin:    General: Skin is warm and dry.  Neurological:     General: No focal deficit present.     Mental Status: She is  alert and oriented to person, place, and time.     Motor: No weakness.     Gait: Gait normal.  Psychiatric:        Mood and Affect: Mood normal.        Behavior: Behavior normal.        Judgment: Judgment normal.         Assessment and Plan   1. Encounter for general adult medical examination with abnormal findings   2. Insomnia, unspecified type      Plan: 1.  Overall exam was normal.  She will follow-up in 1 year for annual physical exam, or sooner as needed for any acute issues.  She was encouraged to have colon cancer screening as scheduled, and to get her second dose of Shingrix by June.  She reports her understanding. 2.  She will continue taking hydroxyzine as needed.   Tests ordered No orders of the defined types were placed in this encounter.     No orders of the defined types were placed in this encounter.   Patient to follow-up in 1 year for annual physical exam or sooner as needed.  Ailene Ards, NP

## 2021-10-21 ENCOUNTER — Other Ambulatory Visit: Payer: Self-pay | Admitting: Nurse Practitioner

## 2021-10-21 DIAGNOSIS — G4700 Insomnia, unspecified: Secondary | ICD-10-CM

## 2022-03-24 ENCOUNTER — Other Ambulatory Visit: Payer: Self-pay | Admitting: Nurse Practitioner

## 2022-03-24 DIAGNOSIS — Z1231 Encounter for screening mammogram for malignant neoplasm of breast: Secondary | ICD-10-CM

## 2022-04-24 ENCOUNTER — Ambulatory Visit: Payer: No Typology Code available for payment source

## 2022-05-09 NOTE — Progress Notes (Deleted)
51 y.o. G3P2002 Divorced White or Caucasian Not Hispanic or Latino female here for annual exam.      No LMP recorded. (Menstrual status: Oral contraceptives).          Sexually active: {yes no:314532}  The current method of family planning is {contraception:315051}.    Exercising: {yes no:314532}  {types:19826} Smoker:  {YES P5382123  Health Maintenance: Pap:   04/18/21 WNL Hr HPV Neg, 08-18-16 WNL, History of abnormal Pap:  yes hx colposcopy and and LEEP in her 20's MMG:  01/25/21 Bi-rads 1 neg  BMD:   n/a Colonoscopy: 11/09/21 polyps f/u 10 years  TDaP:  08/08/11 Gardasil: n/a   reports that she quit smoking about 17 years ago. Her smoking use included cigarettes. She has a 15.00 pack-year smoking history. She has never used smokeless tobacco. She reports current alcohol use of about 4.0 - 6.0 standard drinks of alcohol per week. She reports that she does not use drugs.  Past Medical History:  Diagnosis Date   Abnormal cervical cytology 08/08/2011   Acne 08/08/2011   Anemia 11/06/2011   Chicken pox 25   History of HPV infection    Hypertension in pregnancy, preeclampsia/eclampsia/prior HTN/postp cond    Mole (skin) 08/08/2011   Neutropenia 11/06/2011   --saw hematologist at [place] in [facility]    Past Surgical History:  Procedure Laterality Date   ABDOMINOPLASTY  09-24-13   Dr Stephanie Coup   CERVICAL BIOPSY  W/ LOOP ELECTRODE EXCISION  Age 19   GYNECOLOGIC CRYOSURGERY  18   leap     SKIN BIOPSY     right lower abdominal wall, benign   TONSILLECTOMY  51 yrs old    Current Outpatient Medications  Medication Sig Dispense Refill   hydrOXYzine (ATARAX) 50 MG tablet Take 0.5-1 tablets (25-50 mg total) by mouth at bedtime as needed. 30 tablet 2   Norethindrone-Ethinyl Estradiol-Fe Biphas (LO LOESTRIN FE) 1 MG-10 MCG / 10 MCG tablet Take 1 tablet by mouth daily. Skip the placebo pills and take pills continuously. 112 tablet 3   No current facility-administered medications for this  visit.    Family History  Problem Relation Age of Onset   Hypertension Mother    Transient ischemic attack Mother    Cancer Mother        uterine, tah at 12   Hyperlipidemia Mother    Mental illness Mother    Stroke Mother 7   Cancer Father        lung   Other Father        coronary artery disease   Heart disease Father 13       bypass, triple   Diabetes Father    Hyperlipidemia Father    Hypertension Father    Thyroid disease Father    Mental illness Father    Obesity Sister    Mental illness Sister        bipolar   Drug abuse Sister    Macular degeneration Maternal Grandmother    Blindness Maternal Grandmother    Alzheimer's disease Maternal Grandmother    Cancer Maternal Grandmother        ovarian cancer   Dementia Maternal Grandmother        late onset   Other Maternal Grandfather        stent   Heart disease Maternal Grandfather        stents, MI, pacer for sinus brady   Arthritis Maternal Grandfather    Aneurysm Paternal Grandfather  brain    Review of Systems  Exam:   There were no vitals taken for this visit.  Weight change: '@WEIGHTCHANGE'$ @ Height:      Ht Readings from Last 3 Encounters:  08/18/21 '5\' 5"'$  (1.651 m)  07/14/21 '5\' 5"'$  (1.651 m)  04/18/21 '5\' 5"'$  (1.651 m)    General appearance: alert, cooperative and appears stated age Head: Normocephalic, without obvious abnormality, atraumatic Neck: no adenopathy, supple, symmetrical, trachea midline and thyroid {CHL AMB PHY EX THYROID NORM [phone]::"normal to inspection and palpation"} Lungs: clear to auscultation bilaterally Cardiovascular: regular rate and rhythm Breasts: {Exam; breast:13139::"normal appearance, no masses or tenderness"} Abdomen: soft, non-tender; non distended,  no masses,  no organomegaly Extremities: extremities normal, atraumatic, no cyanosis or edema Skin: Skin color, texture, turgor normal. No rashes or lesions Lymph nodes: Cervical, supraclavicular, and  axillary nodes normal. No abnormal inguinal nodes palpated Neurologic: Grossly normal   Pelvic: External genitalia:  no lesions              Urethra:  normal appearing urethra with no masses, tenderness or lesions              Bartholins and Skenes: normal                 Vagina: normal appearing vagina with normal color and discharge, no lesions              Cervix: {CHL AMB PHY EX CERVIX NORM [phone]::"no lesions"}               Bimanual Exam:  Uterus:  {CHL AMB PHY EX UTERUS NORM DEFAULT:[phone]::"normal size, contour, position, consistency, mobility, non-tender"}              Adnexa: {CHL AMB PHY EX ADNEXA NO MASS [phone]::"no mass, fullness, tenderness"}               Rectovaginal: Confirms               Anus:  normal sphincter tone, no lesions  *** chaperoned for the exam.  A:  Well Woman with normal exam  P:

## 2022-05-16 ENCOUNTER — Ambulatory Visit: Payer: No Typology Code available for payment source | Admitting: Obstetrics and Gynecology

## 2022-05-22 ENCOUNTER — Ambulatory Visit (INDEPENDENT_AMBULATORY_CARE_PROVIDER_SITE_OTHER): Payer: No Typology Code available for payment source | Admitting: Obstetrics and Gynecology

## 2022-05-22 ENCOUNTER — Encounter: Payer: Self-pay | Admitting: Obstetrics and Gynecology

## 2022-05-22 VITALS — BP 110/70 | HR 66 | Ht 65.0 in | Wt 117.0 lb

## 2022-05-22 DIAGNOSIS — Z113 Encounter for screening for infections with a predominantly sexual mode of transmission: Secondary | ICD-10-CM

## 2022-05-22 DIAGNOSIS — Z3041 Encounter for surveillance of contraceptive pills: Secondary | ICD-10-CM | POA: Diagnosis not present

## 2022-05-22 DIAGNOSIS — Z23 Encounter for immunization: Secondary | ICD-10-CM

## 2022-05-22 DIAGNOSIS — Z01419 Encounter for gynecological examination (general) (routine) without abnormal findings: Secondary | ICD-10-CM

## 2022-05-22 MED ORDER — LO LOESTRIN FE 1 MG-10 MCG / 10 MCG PO TABS: 1.0000 | ORAL_TABLET | Freq: Every day | ORAL | 3 refills | Status: DC

## 2022-05-22 NOTE — Patient Instructions (Signed)

## 2022-05-22 NOTE — Progress Notes (Signed)
51 y.o. G74P2002 Divorced White or Caucasian Not Hispanic or Latino female here for annual exam.    On continuous OCP's. No bleeding, doesn't want to stop OCP's this year. She is sexually active, same partner x 7-8 months. No dyspareunia.  She has some GSI with exercise, small amount, stable. She has an appointment with pelvic floor PT.   No bowel c/o.   Had a breast lift in 2/23.   No LMP recorded. (Menstrual status: Oral contraceptives).          Sexually active: Yes.    The current method of family planning is OCP (estrogen/progesterone).    Exercising: Yes.     Cardio strength  Smoker:  no  Health Maintenance: Pap:04/18/21 WNL Hr HPV Neg; 08-18-16 WNL  History of abnormal Pap:  yes hx colposcopy and and LEEP in her 20's MMG:  01/25/21 Bi-rads 1 neg, scheduled next month  BMD:   n/a Colonoscopy: 11/09/21 polyps f/u 10 years  TDaP:  08/08/11 Gardasil: n/a   reports that she quit smoking about 17 years ago. Her smoking use included cigarettes. She has a 15.00 pack-year smoking history. She has never used smokeless tobacco. She reports current alcohol use of about 4.0 - 6.0 standard drinks of alcohol per week. She reports that she does not use drugs. She works in Manufacturing engineer. Trained as an Therapist, sports.  Daughter is 16, son is 68.   Past Medical History:  Diagnosis Date   Abnormal cervical cytology 08/08/2011   Acne 08/08/2011   Anemia 11/06/2011   Chicken pox 25   History of HPV infection    Hypertension in pregnancy, preeclampsia/eclampsia/prior HTN/postp cond    Mole (skin) 08/08/2011   Neutropenia 11/06/2011   --saw hematologist at [place] in [facility]    Past Surgical History:  Procedure Laterality Date   ABDOMINOPLASTY  09/24/2013   Dr Stephanie Coup   BREAST SURGERY     CERVICAL BIOPSY  W/ LOOP ELECTRODE EXCISION  Age 8   COSMETIC SURGERY     GYNECOLOGIC CRYOSURGERY  18   leap     SKIN BIOPSY     right lower abdominal wall, benign   TONSILLECTOMY  51 yrs old    Current  Outpatient Medications  Medication Sig Dispense Refill   B Complex Vitamins (B COMPLEX PO) Take by mouth.     Cyanocobalamin (B-12 PO) Take by mouth.     levocetirizine (XYZAL) 5 MG tablet Take 5 mg by mouth every evening.     magnesium (MAGTAB) 84 MG (7MEQ) TBCR SR tablet Take 84 mg by mouth.     Norethindrone-Ethinyl Estradiol-Fe Biphas (LO LOESTRIN FE) 1 MG-10 MCG / 10 MCG tablet Take 1 tablet by mouth daily. Skip the placebo pills and take pills continuously. 112 tablet 3   No current facility-administered medications for this visit.    Family History  Problem Relation Age of Onset   Hypertension Mother    Transient ischemic attack Mother    Cancer Mother        uterine, tah at 68   Hyperlipidemia Mother    Mental illness Mother    Stroke Mother 19   Cancer Father        lung   Other Father        coronary artery disease   Heart disease Father 23       bypass, triple   Diabetes Father    Hyperlipidemia Father    Hypertension Father    Thyroid  disease Father    Mental illness Father    Obesity Sister    Mental illness Sister        bipolar   Drug abuse Sister    Macular degeneration Maternal Grandmother    Blindness Maternal Grandmother    Alzheimer's disease Maternal Grandmother    Cancer Maternal Grandmother        ovarian cancer   Dementia Maternal Grandmother        late onset   Other Maternal Grandfather        stent   Heart disease Maternal Grandfather        stents, MI, pacer for sinus brady   Arthritis Maternal Grandfather    Aneurysm Paternal Grandfather        brain    Review of Systems  All other systems reviewed and are negative.   Exam:   BP 110/70   Pulse 66   Ht '5\' 5"'$  (1.651 m)   Wt 117 lb (53.1 kg)   SpO2 100%   BMI 19.47 kg/m   Weight change: '@WEIGHTCHANGE'$ @ Height:   Height: '5\' 5"'$  (165.1 cm)  Ht Readings from Last 3 Encounters:  05/22/22 '5\' 5"'$  (1.651 m)  08/18/21 '5\' 5"'$  (1.651 m)  07/14/21 '5\' 5"'$  (1.651 m)    General appearance:  alert, cooperative and appears stated age Head: Normocephalic, without obvious abnormality, atraumatic Neck: no adenopathy, supple, symmetrical, trachea midline and thyroid normal to inspection and palpation Lungs: clear to auscultation bilaterally Cardiovascular: regular rate and rhythm Breasts: normal appearance, no masses or tenderness Abdomen: soft, non-tender; non distended,  no masses,  no organomegaly Extremities: extremities normal, atraumatic, no cyanosis or edema Skin: Skin color, texture, turgor normal. No rashes or lesions Lymph nodes: Cervical, supraclavicular, and axillary nodes normal. No abnormal inguinal nodes palpated Neurologic: Grossly normal   Pelvic: External genitalia:  no lesions              Urethra:  normal appearing urethra with no masses, tenderness or lesions              Bartholins and Skenes: normal                 Vagina: normal appearing vagina with normal color and discharge, no lesions              Cervix: no lesions               Bimanual Exam:  Uterus:  normal size, contour, position, consistency, mobility, non-tender              Adnexa: no mass, fullness, tenderness               Rectovaginal: Confirms               Anus:  normal sphincter tone, no lesions  Gae Dry, CMA chaperoned for the exam.   1. Well woman exam Discussed breast self exam Discussed calcium and vit D intake Mammogram scheduled Colonoscopy and Labs are UTD No pap this year  2. Screening examination for STD (sexually transmitted disease) Declines the blood work - SURESWAB CT/NG/T. vaginalis  3. Encounter for surveillance of contraceptive pills Discussed the risks of OCP's, recommended taking a week off and checking an [place]. She feels so good on these pills and really wants to stay on them for one more year prior to checking an [facility]. After reviewing the risks we agreed to continue for one more year.  - Norethindrone-Ethinyl Estradiol-Fe Biphas (LO  LOESTRIN FE) 1 MG-10  MCG / 10 MCG tablet; Take 1 tablet by mouth daily. Skip the placebo pills and take pills continuously.  Dispense: 112 tablet; Refill: 3  4. Immunization due -Flu shot today - Tdap vaccine greater than or equal to 7yo IM

## 2022-05-23 LAB — SURESWAB CT/NG/T. VAGINALIS
C. trachomatis RNA, TMA: NOT DETECTED
N. gonorrhoeae RNA, TMA: NOT DETECTED
Trichomonas vaginalis RNA: NOT DETECTED

## 2022-06-07 ENCOUNTER — Encounter: Payer: Self-pay | Admitting: Obstetrics and Gynecology

## 2022-06-16 ENCOUNTER — Ambulatory Visit: Payer: No Typology Code available for payment source

## 2022-07-26 ENCOUNTER — Ambulatory Visit
Admission: RE | Admit: 2022-07-26 | Discharge: 2022-07-26 | Disposition: A | Discharge status: Home / Self Care | Payer: No Typology Code available for payment source | Source: Ambulatory Visit | Attending: Nurse Practitioner | Admitting: Nurse Practitioner

## 2022-07-26 DIAGNOSIS — Z1231 Encounter for screening mammogram for malignant neoplasm of breast: Secondary | ICD-10-CM

## 2022-08-29 ENCOUNTER — Telehealth: Payer: No Typology Code available for payment source | Admitting: Nurse Practitioner

## 2022-08-29 DIAGNOSIS — R051 Acute cough: Secondary | ICD-10-CM

## 2022-08-29 DIAGNOSIS — J0110 Acute frontal sinusitis, unspecified: Secondary | ICD-10-CM | POA: Diagnosis not present

## 2022-08-29 MED ORDER — BENZONATATE 100 MG PO CAPS: 100.0000 mg | ORAL_CAPSULE | Freq: Three times a day (TID) | ORAL | 0 refills | Status: DC | PRN

## 2022-08-29 MED ORDER — IPRATROPIUM BROMIDE 0.03 % NA SOLN: 2.0000 | Freq: Two times a day (BID) | NASAL | 12 refills | Status: DC

## 2022-08-29 MED ORDER — DOXYCYCLINE HYCLATE 100 MG PO TABS: 100.0000 mg | ORAL_TABLET | Freq: Two times a day (BID) | ORAL | 0 refills | Status: AC

## 2022-08-29 NOTE — Progress Notes (Signed)
E-Visit for Sinus Problems  We are sorry that you are not feeling well.  Here is how we plan to help!  Based on what you have shared with me it looks like you have sinusitis.  Sinusitis is inflammation and infection in the sinus cavities of the head.  Based on your presentation I believe you most likely have Acute Bacterial Sinusitis.  This is an infection caused by bacteria and is treated with antibiotics. I have prescribed Doxycycline '100mg'$  by mouth twice a day for 10 days.   We will also prescribe a nasal spray and a cough medication: Meds ordered this encounter  Medications   doxycycline (VIBRA-TABS) 100 MG tablet    Sig: Take 1 tablet (100 mg total) by mouth 2 (two) times daily for 10 days.    Dispense:  20 tablet    Refill:  0   ipratropium (ATROVENT) 0.03 % nasal spray    Sig: Place 2 sprays into both nostrils every 12 (twelve) hours.    Dispense:  30 mL    Refill:  12   benzonatate (TESSALON) 100 MG capsule    Sig: Take 1 capsule (100 mg total) by mouth 3 (three) times daily as needed.    Dispense:  30 capsule    Refill:  0     You may use an oral decongestant such as Mucinex D or if you have glaucoma or high blood pressure use plain Mucinex. Saline nasal spray help and can safely be used as often as needed for congestion.  If you develop worsening sinus pain, fever or notice severe headache and vision changes, or if symptoms are not better after completion of antibiotic, please schedule an appointment with a health care provider.    Sinus infections are not as easily transmitted as other respiratory infection, however we still recommend that you avoid close contact with loved ones, especially the very young and elderly.  Remember to wash your hands thoroughly throughout the day as this is the number one way to prevent the spread of infection!  Home Care: Only take medications as instructed by your medical team. Complete the entire course of an antibiotic. Do not take these  medications with alcohol. A steam or ultrasonic humidifier can help congestion.  You can place a towel over your head and breathe in the steam from hot water coming from a faucet. Avoid close contacts especially the very young and the elderly. Cover your mouth when you cough or sneeze. Always remember to wash your hands.  Get Help Right Away If: You develop worsening fever or sinus pain. You develop a severe head ache or visual changes. Your symptoms persist after you have completed your treatment plan.  Make sure you Understand these instructions. Will watch your condition. Will get help right away if you are not doing well or get worse.  Thank you for choosing an e-visit.  Your e-visit answers were reviewed by a board certified advanced clinical practitioner to complete your personal care plan. Depending upon the condition, your plan could have included both over the counter or prescription medications.  Please review your pharmacy choice. Make sure the pharmacy is open so you can pick up prescription now. If there is a problem, you may contact your provider through [employer] and have the prescription routed to another pharmacy.  Your safety is important to [place]. If you have drug allergies check your prescription carefully.   For the next 24 hours you can use MyChart to ask questions  about today's visit, request a non-urgent call back, or ask for a work or school excuse. You will get an email in the next two days asking about your experience. I hope that your e-visit has been valuable and will speed your recovery.   I spent approximately 5 minutes reviewing the patient's history, current symptoms and coordinating their care today.

## 2023-04-14 ENCOUNTER — Encounter: Payer: Self-pay | Admitting: Nurse Practitioner

## 2023-04-27 ENCOUNTER — Encounter: Payer: No Typology Code available for payment source | Admitting: Nurse Practitioner

## 2023-05-29 ENCOUNTER — Ambulatory Visit: Payer: No Typology Code available for payment source | Admitting: Obstetrics and Gynecology

## 2023-06-13 ENCOUNTER — Other Ambulatory Visit: Payer: Self-pay | Admitting: Nurse Practitioner

## 2023-06-13 ENCOUNTER — Encounter: Payer: Self-pay | Admitting: Nurse Practitioner

## 2023-06-13 DIAGNOSIS — Z3041 Encounter for surveillance of contraceptive pills: Secondary | ICD-10-CM

## 2023-06-13 MED ORDER — LO LOESTRIN FE 1 MG-10 MCG / 10 MCG PO TABS: 1.0000 | ORAL_TABLET | Freq: Every day | ORAL | 0 refills | Status: DC

## 2023-06-15 ENCOUNTER — Other Ambulatory Visit: Payer: Self-pay

## 2023-06-15 DIAGNOSIS — Z3041 Encounter for surveillance of contraceptive pills: Secondary | ICD-10-CM

## 2023-06-15 MED ORDER — LO LOESTRIN FE 1 MG-10 MCG / 10 MCG PO TABS: 1.0000 | ORAL_TABLET | Freq: Every day | ORAL | 0 refills | Status: DC

## 2023-06-15 NOTE — Telephone Encounter (Signed)
Former JJ pt LVM in med refill VM stating needs refill on this medication.   No current appt scheduled but does see PCP on 12/26. However, needs refill prior to that per pt.

## 2023-06-15 NOTE — Telephone Encounter (Signed)
Med refill request: Lo Loestrin FE Last AEX: 05/22/22 Susan Mcdaniel) Patient has AEX with PCP 06/21/23 and requests refill until she has appointment. Last MMG (if hormonal med) 07/26/22 BI-RADS Refill authorized: Lo Loestrin FE #[id number].  Sent to provider for approval or denial as appropriate.

## 2023-06-21 ENCOUNTER — Ambulatory Visit (INDEPENDENT_AMBULATORY_CARE_PROVIDER_SITE_OTHER): Payer: No Typology Code available for payment source | Admitting: Nurse Practitioner

## 2023-06-21 ENCOUNTER — Other Ambulatory Visit: Payer: Self-pay | Admitting: Nurse Practitioner

## 2023-06-21 VITALS — BP 104/70 | HR 71 | Temp 97.6°F | Ht 65.0 in | Wt 117.5 lb

## 2023-06-21 DIAGNOSIS — Z136 Encounter for screening for cardiovascular disorders: Secondary | ICD-10-CM

## 2023-06-21 DIAGNOSIS — Z0001 Encounter for general adult medical examination with abnormal findings: Secondary | ICD-10-CM | POA: Diagnosis not present

## 2023-06-21 DIAGNOSIS — Z131 Encounter for screening for diabetes mellitus: Secondary | ICD-10-CM | POA: Diagnosis not present

## 2023-06-21 DIAGNOSIS — Z13220 Encounter for screening for lipoid disorders: Secondary | ICD-10-CM

## 2023-06-21 DIAGNOSIS — D72819 Decreased white blood cell count, unspecified: Secondary | ICD-10-CM

## 2023-06-21 DIAGNOSIS — Z124 Encounter for screening for malignant neoplasm of cervix: Secondary | ICD-10-CM | POA: Diagnosis not present

## 2023-06-21 DIAGNOSIS — Z1159 Encounter for screening for other viral diseases: Secondary | ICD-10-CM

## 2023-06-21 DIAGNOSIS — R319 Hematuria, unspecified: Secondary | ICD-10-CM

## 2023-06-21 DIAGNOSIS — M2550 Pain in unspecified joint: Secondary | ICD-10-CM | POA: Diagnosis not present

## 2023-06-21 LAB — CBC
HCT: 41.1 % (ref 36.0–46.0)
Hemoglobin: 13.8 g/dL (ref 12.0–15.0)
MCHC: 33.5 g/dL (ref 30.0–36.0)
MCV: 97.8 fL (ref 78.0–100.0)
Platelets: 250 10*3/uL (ref 150.0–400.0)
RBC: 4.21 Mil/uL (ref 3.87–5.11)
RDW: 13.3 % (ref 11.5–15.5)
WBC: 3.8 10*3/uL — ABNORMAL LOW (ref 4.0–10.5)

## 2023-06-21 LAB — COMPREHENSIVE METABOLIC PANEL
ALT: 15 U/L (ref 0–35)
AST: 26 U/L (ref 0–37)
Albumin: 4.6 g/dL (ref 3.5–5.2)
Alkaline Phosphatase: 35 U/L — ABNORMAL LOW (ref 39–117)
BUN: 10 mg/dL (ref 6–23)
CO2: 24 meq/L (ref 19–32)
Calcium: 9.3 mg/dL (ref 8.4–10.5)
Chloride: 102 meq/L (ref 96–112)
Creatinine, Ser: 0.78 mg/dL (ref 0.40–1.20)
GFR: 87.32 mL/min (ref >60.00)
Glucose, Bld: 99 mg/dL (ref 70–99)
Potassium: 4 meq/L (ref 3.5–5.1)
Sodium: 135 meq/L (ref 135–145)
Total Bilirubin: 0.6 mg/dL (ref 0.2–1.2)
Total Protein: 7.4 g/dL (ref 6.0–8.3)

## 2023-06-21 LAB — URINALYSIS, ROUTINE W REFLEX MICROSCOPIC
Bilirubin Urine: NEGATIVE
Ketones, ur: NEGATIVE
Leukocytes,Ua: NEGATIVE
Nitrite: NEGATIVE
Specific Gravity, Urine: <=1.005 — AB (ref 1.000–1.030)
Total Protein, Urine: NEGATIVE
Urine Glucose: NEGATIVE
Urobilinogen, UA: 0.2 (ref 0.0–1.0)
pH: 6.5 (ref 5.0–8.0)

## 2023-06-21 LAB — POCT URINALYSIS DIPSTICK
Bilirubin, UA: NEGATIVE
Blood, UA: POSITIVE
Glucose, UA: NEGATIVE
Ketones, UA: NEGATIVE
Leukocytes, UA: NEGATIVE
Nitrite, UA: NEGATIVE
Protein, UA: NEGATIVE
Spec Grav, UA: 1.01 (ref 1.010–1.025)
Urobilinogen, UA: 0.2 U/dL
pH, UA: 6 (ref 5.0–8.0)

## 2023-06-21 LAB — LIPID PANEL
Cholesterol: 198 mg/dL (ref 0–200)
HDL: 92.5 mg/dL (ref >39.00)
LDL Cholesterol: 84 mg/dL (ref 0–99)
NonHDL: 105.57
Total CHOL/HDL Ratio: 2
Triglycerides: 109 mg/dL (ref 0.0–149.0)
VLDL: 21.8 mg/dL (ref 0.0–40.0)

## 2023-06-21 LAB — VITAMIN D 25 HYDROXY (VIT D DEFICIENCY, FRACTURES): VITD: 63.69 ng/mL (ref 30.00–100.00)

## 2023-06-21 LAB — MICROALBUMIN / CREATININE URINE RATIO
Creatinine,U: 34.4 mg/dL
Microalb Creat Ratio: 2 mg/g (ref 0.0–30.0)
Microalb, Ur: <0.7 mg/dL (ref 0.0–1.9)

## 2023-06-21 LAB — VITAMIN B12: Vitamin B-12: 641 pg/mL (ref 211–911)

## 2023-06-21 LAB — TSH: TSH: 1.34 u[IU]/mL (ref 0.35–5.50)

## 2023-06-21 LAB — HEMOGLOBIN A1C: Hgb A1c MFr Bld: 5.5 % (ref 4.6–6.5)

## 2023-06-21 NOTE — Assessment & Plan Note (Signed)
Referral to OBGYN made today.  

## 2023-06-21 NOTE — Assessment & Plan Note (Signed)
Labs ordered, further recommendations may be made based upon his results. 

## 2023-06-21 NOTE — Progress Notes (Signed)
Complete physical exam  Patient: Susan Mcdaniel   DOB: May 24, 1971   52 y.o. Female  MRN: [id number]  Subjective:    Chief Complaint  Patient presents with   Annual Exam    Susan Mcdaniel is a 52 y.o. female who presents today for a complete physical exam. She reports consuming a general diet.  Exercise: yoga and weight training and hiking - 5-6 days/week.  She generally feels well. She reports sleeping well. She does have additional problems to discuss today.   Arthralgia: right hand, 2nd digit, carpometacarpal joint has been causing patient pain.  She has noticed swelling in this joint as well.  Most bothersome when trying to grip or twist things.  Has a family history of rheumatoid arthritis.  Also underwent evaluation for possible ankylosing spondylitis in the past.  Has daily discomfort.  Pain worsens with use.   Most recent fall risk assessment:    06/21/2023   10:19 AM  Fall Risk   Falls in the past year? 0  Number falls in past yr: 0  Injury with Fall? 0  Risk for fall due to : No Fall Risks  Follow up Falls evaluation completed     Most recent depression screenings:    06/21/2023   10:19 AM 07/14/2021    3:11 PM  PHQ 2/9 Scores  PHQ - 2 Score 0 0    Vision:Not within last year  and Dental: No current dental problems and Receives regular dental care  Patient Active Problem List   Diagnosis Date Noted   Cervical cancer screening 06/21/2023   Arthralgia 06/21/2023   Encounter for lipid screening for cardiovascular disease 06/21/2023   Diabetes mellitus screening 06/21/2023   Encounter for general adult medical examination with abnormal findings 06/21/2023   Encounter for hepatitis C screening test for low risk patient 06/21/2023   Hematuria 06/21/2023   Insomnia 08/18/2021   SOB (shortness of breath) on exertion 08/13/2014   Dysuria 08/13/2014   BMI less than 19,adult 08/13/2014   Neutropenia (HCC) 11/06/2011   Anemia 11/06/2011   Abnormal cervical  cytology 08/08/2011   Preventative health care 08/08/2011   Past Medical History:  Diagnosis Date   Abnormal cervical cytology 08/08/2011   Acne 08/08/2011   Anemia 11/06/2011   Chicken pox 25   History of HPV infection    Hypertension in pregnancy, preeclampsia/eclampsia/prior HTN/postp cond    Mole (skin) 08/08/2011   Neutropenia 11/06/2011   --saw hematologist at [facility]   Past Surgical History:  Procedure Laterality Date   ABDOMINOPLASTY  09/24/2013   Dr Benna Dunks   BREAST SURGERY     CERVICAL BIOPSY  W/ LOOP ELECTRODE EXCISION  Age 43   COSMETIC SURGERY     GYNECOLOGIC CRYOSURGERY  18   leap     SKIN BIOPSY     right lower abdominal wall, benign   TONSILLECTOMY  52 yrs old   Social History   Socioeconomic History   Marital status: Divorced    Spouse name: Onalee Hua   Number of children: 2   Years of education: BSN   Highest education level: Not on file  Occupational History   Occupation: Producer, television/film/video  Tobacco Use   Smoking status: Former    Current packs/day: 0.00    Average packs/day: 1 pack/day for 15.0 years (15.0 ttl pk-yrs)    Types: Cigarettes    Start date: 06/26/1989    Quit date: 06/26/2004    Years  since quitting: 18.9   Smokeless tobacco: Never  Vaping Use   Vaping status: Never Used  Substance and Sexual Activity   Alcohol use: Yes    Alcohol/week: 4.0 - 6.0 standard drinks of alcohol    Types: 4 - 6 Standard drinks or equivalent per week   Drug use: No   Sexual activity: Yes    Partners: Male    Birth control/protection: Inserts    Comment: Nuvaring  Other Topics Concern   Not on file  Social History Narrative   Married to [place]. 2 children Chrissie Noa and [place].    BSN- Field seismologist.    Drinks caffeine.    Herbal remedies, daily vitamin.   Wears her seatbelt. Smoke detector in the home.    Exercises routinely.    Diet: no beef or pork.    Feels safe in her relationships.    Social Drivers of Corporate investment banker  Strain: Not on file  Food Insecurity: Not on file  Transportation Needs: Not on file  Physical Activity: Not on file  Stress: Not on file  Social Connections: Not on file  Intimate Partner Violence: Not on file   Family History  Problem Relation Age of Onset   Hypertension Mother    Transient ischemic attack Mother    Cancer Mother        uterine, tah at 76   Hyperlipidemia Mother    Mental illness Mother    Stroke Mother 74   Cancer Father        lung   Other Father        coronary artery disease   Heart disease Father 28       bypass, triple   Diabetes Father    Hyperlipidemia Father    Hypertension Father    Thyroid disease Father    Mental illness Father    Obesity Sister    Mental illness Sister        bipolar   Drug abuse Sister    Macular degeneration Maternal Grandmother    Blindness Maternal Grandmother    Alzheimer's disease Maternal Grandmother    Cancer Maternal Grandmother        ovarian cancer   Dementia Maternal Grandmother        late onset   Other Maternal Grandfather        stent   Heart disease Maternal Grandfather        stents, MI, pacer for sinus brady   Arthritis Maternal Grandfather    Aneurysm Paternal Grandfather        brain   No Known Allergies    Patient Care Team: Elenore Paddy, NP as PCP - General (Nurse Practitioner) Josph Macho, MD as Consulting Physician (Oncology) Romualdo Bolk, MD (Inactive) as Consulting Physician (Obstetrics and Gynecology)   Outpatient Medications Prior to Visit  Medication Sig   B Complex Vitamins (B COMPLEX PO) Take by mouth.   Cyanocobalamin (B-12 PO) Take by mouth.   ipratropium (ATROVENT) 0.03 % nasal spray Place 2 sprays into both nostrils every 12 (twelve) hours.   magnesium (MAGTAB) 84 MG ( ) TBCR SR tablet Take 84 mg by mouth.   Norethindrone-Ethinyl Estradiol-Fe Biphas (LO LOESTRIN FE) 1 MG-10 MCG / 10 MCG tablet Take 1 tablet by mouth daily. Skip the placebo pills and take  pills continuously.   [DISCONTINUED] benzonatate (TESSALON) 100 MG capsule Take 1 capsule (100 mg total) by mouth 3 (three) times daily as needed.  levocetirizine (XYZAL) 5 MG tablet Take 5 mg by mouth every evening. (Patient not taking: Reported on 06/21/2023)   No facility-administered medications prior to visit.    Review of Systems  Constitutional:  Negative for chills and fever.  HENT:  Negative for hearing loss and tinnitus.   Eyes:  Negative for blurred vision and double vision.  Respiratory:  Negative for cough, shortness of breath and wheezing.   Cardiovascular:  Negative for chest pain and palpitations.  Gastrointestinal:  Negative for abdominal pain, blood in stool, nausea and vomiting.  Genitourinary:  Negative for dysuria and hematuria.  Musculoskeletal:  Positive for joint pain.  Skin:  Negative for itching and rash.  Neurological:  Negative for dizziness, seizures, loss of consciousness and headaches.  Psychiatric/Behavioral:  Negative for depression. The patient is not nervous/anxious and does not have insomnia.           Objective:     BP 104/70   Pulse 71   Temp 97.6 F (36.4 C) (Temporal)   Ht 5\' 5"  (1.651 m)   Wt 117 lb 8 oz (53.3 kg)   SpO2 100%   BMI 19.55 kg/m  BP Readings from Last 3 Encounters:  06/21/23 104/70  05/22/22 110/70  08/18/21 110/62   Wt Readings from Last 3 Encounters:  06/21/23 117 lb 8 oz (53.3 kg)  05/22/22 117 lb (53.1 kg)  08/18/21 114 lb 9.6 oz (52 kg)        06/21/2023   10:19 AM 07/14/2021    3:11 PM 08/23/2016    1:07 PM  PHQ9 SCORE ONLY  PHQ-9 Total Score 0 0 0     Physical Exam Vitals reviewed. Exam conducted with a chaperone present.  Constitutional:      Appearance: Normal appearance.  HENT:     Head: Normocephalic and atraumatic.     Right Ear: Tympanic membrane, ear canal and external ear normal.     Left Ear: Tympanic membrane, ear canal and external ear normal.  Eyes:     General:        Right  eye: No discharge.        Left eye: No discharge.     Extraocular Movements: Extraocular movements intact.     Conjunctiva/sclera: Conjunctivae normal.     Pupils: Pupils are equal, round, and reactive to light.  Neck:     Vascular: No carotid bruit.  Cardiovascular:     Rate and Rhythm: Normal rate and regular rhythm.     Pulses: Normal pulses.     Heart sounds: Normal heart sounds. No murmur heard. Pulmonary:     Effort: Pulmonary effort is normal.     Breath sounds: Normal breath sounds.  Chest:  Breasts:    Breasts are symmetrical.     Right: Normal.     Left: Normal.  Abdominal:     General: Abdomen is flat. Bowel sounds are normal. There is no distension.     Palpations: Abdomen is soft. There is no mass.     Tenderness: There is no abdominal tenderness.  Musculoskeletal:        General: No tenderness.     Right hand: Swelling and deformity present.       Hands:     Cervical back: Neck supple. No muscular tenderness.     Right lower leg: No edema.     Left lower leg: No edema.  Lymphadenopathy:     Cervical: No cervical adenopathy.     Upper Body:  Right upper body: No supraclavicular adenopathy.     Left upper body: No supraclavicular adenopathy.  Skin:    General: Skin is warm and dry.  Neurological:     General: No focal deficit present.     Mental Status: She is alert and oriented to person, place, and time.     Motor: No weakness.     Gait: Gait normal.  Psychiatric:        Mood and Affect: Mood normal.        Behavior: Behavior normal.        Judgment: Judgment normal.      Results for orders placed or performed in visit on 06/21/23  POCT urinalysis dipstick  Result Value Ref Range   Color, UA     Clarity, UA     Glucose, UA Negative Negative   Bilirubin, UA negative    Ketones, UA negative    Spec Grav, UA 1.010 1.010 - 1.025   Blood, UA positive    pH, UA 6.0 5.0 - 8.0   Protein, UA Negative Negative   Urobilinogen, UA 0.2 0.2 or 1.0  E.U./dL   Nitrite, UA negative    Leukocytes, UA Negative Negative   Appearance     Odor         Assessment & Plan:    Routine Health Maintenance and Physical Exam  Immunization History  Administered Date(s) Administered   DTaP 03/30/1971, 05/04/1971, 07/08/1971   Hepatitis B 05/07/2002   IPV 03/30/1971, 05/04/1971, 04/27/1972, 11/19/1972   Influenza Inj Mdck Quad Pf 03/22/2018   Influenza Whole 07/28/2011   Influenza, Seasonal, Injecte, Preservative Fre 03/12/2014   Influenza,inj,Quad PF,6+ Mos 03/25/2017, 04/18/2021, 05/22/2022   Influenza-Unspecified 03/22/2018, 02/16/2020, 03/27/2023   MMR 01/17/1972, 08/08/2011   PFIZER(Purple Top)SARS-COV-2 Vaccination 04/21/2020   PPD Test 06/10/2014, 06/10/2015, 06/29/2016, 07/02/2017, 06/30/2018   Pfizer Covid-19 Vaccine Bivalent Booster 22yrs & up 06/10/2021   Td 11/28/2006   Tdap 08/08/2011, 05/22/2022   Varicella 11/28/2006   Zoster Recombinant(Shingrix) 06/10/2021      Health Maintenance  Topic Date Due   Hepatitis C Screening  Never done   COVID-19 Vaccine (3 - 2024-25 season) 07/07/2023 (Originally 02/25/2023)   Zoster Vaccines- Shingrix (2 of 2) 09/19/2023 (Originally 08/05/2021)   HIV Screening  06/20/2024 (Originally 01/12/1986)   MAMMOGRAM  07/26/2024   Cervical Cancer Screening (HPV/Pap Cotest)  04/18/2026   Colonoscopy  11/10/2031   DTaP/Tdap/Td (7 - Td or Tdap) 05/22/2032   INFLUENZA VACCINE  Completed   HPV VACCINES  Aged Out    Discussed health benefits of physical activity, and encouraged her to engage in regular exercise appropriate for her age and condition.  Problem List Items Addressed This Visit       Other   Cervical cancer screening   Referral to OB/GYN made today      Relevant Orders   Ambulatory referral to Obstetrics / Gynecology   TSH   VITAMIN D 25 Hydroxy (Vit-D Deficiency, Fractures)   POCT urinalysis dipstick (Completed)   Arthralgia   Chronic, concern for rheumatoid arthritis. Labs  ordered, further recommendations may be made based upon the results. Referral to rheumatology also made today.      Relevant Orders   Ambulatory referral to Rheumatology   Rheumatoid factor   Cyclic citrul peptide antibody, IgG ([facility])   Antinuclear Antib (ANA)   CBC   Comprehensive metabolic panel   Hemoglobin A1c   Lipid panel   TSH   Vitamin B12   VITAMIN  D 25 Hydroxy (Vit-D Deficiency, Fractures)   POCT urinalysis dipstick (Completed)   Encounter for lipid screening for cardiovascular disease   Labs ordered, further recommendations may be made based upon his results       Relevant Orders   CBC   Comprehensive metabolic panel   Hemoglobin A1c   Lipid panel   TSH   Vitamin B12   VITAMIN D 25 Hydroxy (Vit-D Deficiency, Fractures)   POCT urinalysis dipstick (Completed)   Diabetes mellitus screening   Labs ordered, further recommendations may be made based upon his results       Relevant Orders   CBC   Comprehensive metabolic panel   Hemoglobin A1c   Lipid panel   TSH   Vitamin B12   VITAMIN D 25 Hydroxy (Vit-D Deficiency, Fractures)   POCT urinalysis dipstick (Completed)   Encounter for general adult medical examination with abnormal findings - Primary   Recommended screenings and vaccinations discussed today.  Handout provided.      Relevant Orders   CBC   Comprehensive metabolic panel   Hemoglobin A1c   Lipid panel   TSH   Vitamin B12   VITAMIN D 25 Hydroxy (Vit-D Deficiency, Fractures)   POCT urinalysis dipstick (Completed)   Encounter for hepatitis C screening test for low risk patient   Labs ordered, further recommendations may be made based upon his results       Relevant Orders   Hepatitis C antibody   POCT urinalysis dipstick (Completed)   Hematuria   Incidental finding on point-of-care urinalysis today.  Will send urine out for further testing.  Patient denies being on menstrual cycle currently.      Relevant Orders   Microalbumin /  creatinine urine ratio   Urinalysis, Routine w reflex microscopic   Urine Culture   Return in about 1 year (around 06/20/2024) for CPE with Markeia Harkless.     Elenore Paddy, NP

## 2023-06-21 NOTE — Assessment & Plan Note (Signed)
Chronic, concern for rheumatoid arthritis. Labs ordered, further recommendations may be made based upon the results. Referral to rheumatology also made today.

## 2023-06-21 NOTE — Assessment & Plan Note (Signed)
Recommended screenings and vaccinations discussed today.  Handout provided.

## 2023-06-21 NOTE — Assessment & Plan Note (Signed)
Incidental finding on point-of-care urinalysis today.  Will send urine out for further testing.  Patient denies being on menstrual cycle currently.

## 2023-06-22 ENCOUNTER — Encounter: Payer: Self-pay | Admitting: Nurse Practitioner

## 2023-06-24 LAB — CYCLIC CITRUL PEPTIDE ANTIBODY, IGG: Cyclic Citrullin Peptide Ab: <16 U

## 2023-06-24 LAB — HEPATITIS C ANTIBODY: Hepatitis C Ab: NONREACTIVE

## 2023-06-24 LAB — URINE CULTURE: Result:: NO GROWTH

## 2023-06-24 LAB — RHEUMATOID FACTOR: Rheumatoid fact SerPl-aCnc: <10 [IU]/mL (ref <14)

## 2023-06-24 LAB — ANTI-NUCLEAR AB-TITER (ANA TITER): ANA Titer 1: 1:40 {titer} — ABNORMAL HIGH

## 2023-06-24 LAB — ANA: Anti Nuclear Antibody (ANA): POSITIVE — AB

## 2023-06-26 ENCOUNTER — Other Ambulatory Visit: Payer: Self-pay | Admitting: Nurse Practitioner

## 2023-06-26 DIAGNOSIS — R319 Hematuria, unspecified: Secondary | ICD-10-CM

## 2023-06-28 ENCOUNTER — Encounter: Payer: Self-pay | Admitting: Nurse Practitioner

## 2023-06-28 ENCOUNTER — Other Ambulatory Visit: Payer: Self-pay

## 2023-06-28 DIAGNOSIS — M25549 Pain in joints of unspecified hand: Secondary | ICD-10-CM

## 2023-07-24 ENCOUNTER — Encounter: Payer: Self-pay | Admitting: Internal Medicine

## 2023-07-24 ENCOUNTER — Ambulatory Visit: Payer: No Typology Code available for payment source | Attending: Internal Medicine | Admitting: Internal Medicine

## 2023-07-24 ENCOUNTER — Ambulatory Visit: Payer: No Typology Code available for payment source

## 2023-07-24 VITALS — BP 115/76 | HR 77 | Resp 14 | Ht 64.25 in | Wt 115.0 lb

## 2023-07-24 DIAGNOSIS — D709 Neutropenia, unspecified: Secondary | ICD-10-CM

## 2023-07-24 DIAGNOSIS — M25541 Pain in joints of right hand: Secondary | ICD-10-CM | POA: Diagnosis not present

## 2023-07-24 DIAGNOSIS — R768 Other specified abnormal immunological findings in serum: Secondary | ICD-10-CM | POA: Diagnosis not present

## 2023-07-24 NOTE — Progress Notes (Signed)
Office Visit Note  Patient: Susan Mcdaniel             Date of Birth: [birth year]           MRN: [id number]             PCP: Elenore Paddy, NP Referring: Elenore Paddy, NP Visit Date: 07/24/2023 Occupation: [employer]  Subjective:  New Patient (Initial Visit) (Patient states she has pain in the knuckle of the 2nd digit on her right hand. Patient states her knuckle is better in the morning. Patient states she saw someone years ago for possible ankylosing spondylitis. )   Discussed the use of AI scribe software for clinical note transcription with the patient, who gave verbal consent to proceed.  History of Present Illness   Susan Mcdaniel is a 53 y.o. female here for evaluation of positive ANA checked associated with joint pain and swelling in several areas mostly hands.  She has been experiencing joint problems and inflammation, particularly in her hands, since October. The symptoms include persistent swelling and pain, which vary in intensity but have not resolved since onset. The pain is better in the morning and when it's cold, which she describes as 'kind of the opposite of osteo.' The pain affects her daily activities, including opening lids, using zippers, and writing. She has been taking ibuprofen, 600 mg twice a day, but has weaned off due to concerns about long-term use. No numbness or significant change in grip strength, but she reports dropping things more frequently. There is no redness, warmth, or discoloration noted, although heat tends to exacerbate the symptoms. She has not had any previous injuries to the hands and has considered the possibility of gout, but eliminating alcohol did not alleviate symptoms.  She has a history of Raynaud's phenomenon, experiencing episodes of her fingers turning white and purple, which has been occurring intermittently for 10 to 15 years. No circulation problems like fingers or toes turning white or blue during the review of symptoms.  She  has a history of rib pain and costochondritis, which she associates with her middle back pain. This has been a long-standing issue, and she describes her sternum as 'super tender.' No low back pain, inflammatory eye problems, or bowel issues.  Her mother has rheumatoid arthritis, which has caused significant deformity in her fingers, and kyphosis. Her grandfather had kyphosis, and her daughter has scoliosis. She is concerned about developing similar deformities.  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 She engages in yoga and strength training regularly.     Labs reviewed 05/2023 ANA 1:40 homogenous RF neg CCP neg WBC 3.8  Activities of Daily Living:  Patient reports morning stiffness for 0 minute.   Patient Reports nocturnal pain.  Difficulty dressing/grooming: Denies Difficulty climbing stairs: Denies Difficulty getting out of chair: Denies Difficulty using hands for taps, buttons, cutlery, and/or writing: Reports  Review of Systems  Constitutional:  Negative for fatigue.  HENT:  Negative for mouth sores and mouth dryness.   Eyes:  Negative for dryness.  Respiratory:  Negative for shortness of breath.   Cardiovascular:  Negative for chest pain and palpitations.  Gastrointestinal:  Negative for blood in stool, constipation and diarrhea.  Endocrine: Negative for increased urination.  Genitourinary:  Negative for involuntary urination.  Musculoskeletal:  Positive for joint pain, joint pain and joint swelling. Negative for gait problem, myalgias, muscle weakness, morning stiffness, muscle tenderness and myalgias.  Skin:  Negative for color change, rash, hair loss and sensitivity to sunlight.  Allergic/Immunologic: Negative for susceptible to infections.  Neurological:  Negative for dizziness and headaches.  Hematological:  Negative for swollen glands.  Psychiatric/Behavioral:  Negative for depressed mood and sleep  disturbance. The patient is not nervous/anxious.     PMFS History:  Patient Active Problem List   Diagnosis Date Noted   Positive ANA (antinuclear antibody) 07/24/2023   Cervical cancer screening 06/21/2023   Arthralgia 06/21/2023   Encounter for lipid screening for cardiovascular disease 06/21/2023   Diabetes mellitus screening 06/21/2023   Encounter for general adult medical examination with abnormal findings 06/21/2023   Encounter for hepatitis C screening test for low risk patient 06/21/2023   Hematuria 06/21/2023   Insomnia 08/18/2021   SOB (shortness of breath) on exertion 08/13/2014   Dysuria 08/13/2014   BMI less than 19,adult 08/13/2014   Neutropenia (HCC) 11/06/2011   Anemia 11/06/2011   Abnormal cervical cytology 08/08/2011   Preventative health care 08/08/2011    Past Medical History:  Diagnosis Date   Abnormal cervical cytology 08/08/2011   Acne 08/08/2011   Anemia 11/06/2011   Chicken pox 25   History of HPV infection    Hypertension in pregnancy, preeclampsia/eclampsia/prior HTN/postp cond    Mole (skin) 08/08/2011   Neutropenia 11/06/2011   --saw hematologist at [facility] in [place]    Family History  Problem Relation Age of Onset   Hypertension Mother    Transient ischemic attack Mother    Cancer Mother        uterine, tah at 63   Hyperlipidemia Mother    Mental illness Mother    Stroke Mother 46   Rheum arthritis Mother    Cancer Father        lung   Other Father        coronary artery disease   Heart disease Father 25       bypass, triple   Diabetes Father    Hyperlipidemia Father    Hypertension Father    Thyroid disease Father    Mental illness Father    Obesity Sister    Mental illness Sister        bipolar   Drug abuse Sister    Macular degeneration Maternal Grandmother    Blindness Maternal Grandmother    Alzheimer's disease Maternal Grandmother    Cancer Maternal Grandmother        ovarian cancer   Dementia Maternal Grandmother         late onset   Other Maternal Grandfather        stent   Heart disease Maternal Grandfather        stents, MI, pacer for sinus brady   Arthritis Maternal Grandfather    Aneurysm Paternal Grandfather        brain   Past Surgical History:  Procedure Laterality Date   ABDOMINOPLASTY  09/24/2013   Dr Benna Dunks   BREAST SURGERY     CERVICAL BIOPSY  W/ LOOP ELECTRODE EXCISION  Age 24   COSMETIC SURGERY     GYNECOLOGIC CRYOSURGERY  18   leap     SKIN BIOPSY     right lower abdominal wall, benign   TONSILLECTOMY  53 yrs old   Social History   Social History Narrative   Married to [place]. 2 children Chrissie Noa and [place].    BSN- Field seismologist.    Drinks caffeine.    Herbal remedies, daily vitamin.   Wears  her seatbelt. Smoke detector in the home.    Exercises routinely.    Diet: no beef or pork.    Feels safe in her relationships.    Immunization History  Administered Date(s) Administered   DTaP 03/30/1971, 05/04/1971, 07/08/1971   Hepatitis B 05/07/2002   IPV 03/30/1971, 05/04/1971, 04/27/1972, 11/19/1972   Influenza Inj Mdck Quad Pf 03/22/2018   Influenza Whole 07/28/2011   Influenza, Seasonal, Injecte, Preservative Fre 03/12/2014   Influenza,inj,Quad PF,6+ Mos 03/25/2017, 04/18/2021, 05/22/2022   Influenza-Unspecified 03/22/2018, 02/16/2020, 03/27/2023   MMR 01/17/1972, 08/08/2011   PFIZER(Purple Top)SARS-COV-2 Vaccination 04/21/2020   PPD Test 06/10/2014, 06/10/2015, 06/29/2016, 07/02/2017, 06/30/2018   Pfizer Covid-19 Vaccine Bivalent Booster 42yrs & up 06/10/2021   Td 11/28/2006   Tdap 08/08/2011, 05/22/2022   Varicella 11/28/2006   Zoster Recombinant(Shingrix) 06/10/2021     Objective: Vital Signs: BP 115/76 (BP Location: Left Arm, Patient Position: Sitting, Cuff Size: Normal)   Pulse 77   Resp 14   Ht 5' 4.25" (1.632 m)   Wt 115 lb (52.2 kg)   BMI 19.59 kg/m    Physical Exam HENT:     Mouth/Throat:     Mouth: Mucous membranes are moist.     Pharynx:  Oropharynx is clear.  Eyes:     Conjunctiva/sclera: Conjunctivae normal.  Cardiovascular:     Rate and Rhythm: Normal rate and regular rhythm.  Pulmonary:     Effort: Pulmonary effort is normal.     Breath sounds: Normal breath sounds.  Musculoskeletal:     Right lower leg: No edema.     Left lower leg: No edema.  Lymphadenopathy:     Cervical: No cervical adenopathy.  Skin:    General: Skin is warm and dry.     Findings: No rash.  Neurological:     Mental Status: She is alert.  Psychiatric:        Mood and Affect: Mood normal.      Musculoskeletal Exam:  Shoulders full ROM no tenderness or swelling Elbows full ROM no tenderness or swelling Wrists full ROM no tenderness or swelling Fingers full ROM no tenderness or swelling, right 2nd MCP joint with reducible lateral deviation, nodule or soft tissue thickening on radial side of joint Tenderness to pressure over sternum from xiphoid to Neabsco joints, no swelling Right sided chest wall tenderness lateral from around midaxillary line No paraspinal tenderness to palpation over upper and lower back Hip normal internal and external rotation without pain, no tenderness to lateral hip palpation Knees full ROM no tenderness or swelling, bilateral patellofemoral crepitus Ankles full ROM no tenderness or swelling   Investigation: No additional findings.  Imaging: No results found.  Recent Labs: Lab Results  Component Value Date   WBC 3.8 (L) 06/21/2023   HGB 13.8 06/21/2023   PLT 250.0 06/21/2023   NA 135 06/21/2023   K 4.0 06/21/2023   CL 102 06/21/2023   CO2 24 06/21/2023   GLUCOSE 99 06/21/2023   BUN 10 06/21/2023   CREATININE 0.78 06/21/2023   BILITOT 0.6 06/21/2023   ALKPHOS 35 (L) 06/21/2023   AST 26 06/21/2023   ALT 15 06/21/2023   PROT 7.4 06/21/2023   ALBUMIN 4.6 06/21/2023   CALCIUM 9.3 06/21/2023   GFRAA 99 12/18/2019    Speciality Comments: No specialty comments available.  Procedures:  No procedures  performed Allergies: Patient has no known allergies.   Assessment / Plan:     Visit Diagnoses: Positive ANA (antinuclear antibody) - Plan: RNP Antibody,  Anti-Smith antibody, Sjogrens syndrome-A extractable nuclear antibody, Anti-DNA antibody, double-stranded, C3 and C4 Chronic hand pain and swelling since October, worse with movement, not relieved by ibuprofen. Family history of rheumatoid arthritis. No numbness or stiffness. -Order additional blood tests to further evaluate for autoimmune disease.  Arthralgia of right hand - Plan: XR Hand 2 View Right, Sedimentation rate, C-reactive protein Discussed nodule development and pain and stiffness at right second MCP.  There is clearly some joint thickening or hypertrophy on exam and seen on ultrasound but with no effusion or hyperemia or underlying osteophytes account for this.  I do think she will benefit to keep the planned follow-up with Dr. Denese Killings.  If they do not have any other specific recommendation or treatment at that time can consider trial of intra-articular injection.  Neutropenia, unspecified type (HCC) - Plan: IgG, IgA, IgM Also check immunoglobulins in setting of positive ANA and chronic mild leukopenia.  Does not describe history of frequent or complicated infection.  Raynaud's Phenomenon History of fingers turning white and purple in response to cold exposure, ongoing for 10-15 years. No treatment to date.  More suggestive for atypical or primary disorder.  No evidence of ischemic injury or complications.  Chronic Rib Pain Chronic mid-back and rib pain, tenderness is present reproduced on pressure with exam today.  But with the chronicity and description also not typical for costochondritis.  Does not seem typical for the axial inflammatory syndrome and without other associated changes.  Patellofemoral Syndrome Crepitus in the knee without pain. No treatment required at this time.    Orders: Orders Placed This Encounter   Procedures   XR Hand 2 View Right   RNP Antibody   Anti-Smith antibody   Sjogrens syndrome-A extractable nuclear antibody   Anti-DNA antibody, double-stranded   C3 and C4   Sedimentation rate   C-reactive protein   IgG, IgA, IgM   No orders of the defined types were placed in this encounter.   Follow-Up Instructions: No follow-ups on file.   Fuller Plan, MD  Note - This record has been created using [employer].  Chart creation errors have been sought, but may not always  have been located. Such creation errors do not reflect on  the standard of medical care.

## 2023-07-25 LAB — ANTI-SMITH ANTIBODY: ENA SM Ab Ser-aCnc: <1 AI

## 2023-07-25 LAB — SJOGRENS SYNDROME-A EXTRACTABLE NUCLEAR ANTIBODY: SSA (Ro) (ENA) Antibody, IgG: <1 AI

## 2023-07-25 LAB — C3 AND C4
C3 Complement: 109 mg/dL (ref 83–193)
C4 Complement: 16 mg/dL (ref 15–57)

## 2023-07-25 LAB — ANTI-DNA ANTIBODY, DOUBLE-STRANDED: ds DNA Ab: <1 [IU]/mL

## 2023-07-25 LAB — RNP ANTIBODY: Ribonucleic Protein(ENA) Antibody, IgG: <1 AI

## 2023-07-25 LAB — IGG, IGA, IGM
IgG (Immunoglobin G), Serum: 904 mg/dL (ref 600–1640)
IgM, Serum: 102 mg/dL (ref 50–300)
Immunoglobulin A: 230 mg/dL (ref 47–310)

## 2023-07-25 LAB — SEDIMENTATION RATE: Sed Rate: 6 mm/h (ref 0–30)

## 2023-07-25 LAB — C-REACTIVE PROTEIN: CRP: <3 mg/L (ref <8.0)

## 2023-07-26 NOTE — Progress Notes (Signed)
Lab results are all normal negative for any specific antibodies.  Sed rate and CRP and complement were normal.  Immunoglobulin levels are also normal does not suggest any immunodeficiency problem that could be associated with her low white blood cells.  Hand x-ray looks completely normal there is no visible osteoarthritis in the affected joint.  I recommend she keep her scheduled appointment with Dr. Denese Killings next month. He should be able to recommend if there is a specific procedure or treatment needed. I don't think we would need to make a follow up appointment unless needing to try a steroid injection as previously discussed.

## 2023-07-30 ENCOUNTER — Ambulatory Visit (INDEPENDENT_AMBULATORY_CARE_PROVIDER_SITE_OTHER): Payer: No Typology Code available for payment source | Admitting: Orthopedic Surgery

## 2023-07-30 DIAGNOSIS — M79641 Pain in right hand: Secondary | ICD-10-CM

## 2023-07-30 NOTE — Progress Notes (Signed)
Susan Mcdaniel - 53 y.o. female MRN [id number]  Date of birth: September 20, 1970  Office Visit Note: Visit Date: 07/30/2023 PCP: Elenore Paddy, NP Referred by: Elenore Paddy, NP  Subjective: No chief complaint on file.  HPI: Susan Mcdaniel is a pleasant 53 y.o. female who presents today for evaluation of right hand index finger MCP swelling and discomfort that is been present now for approximately 5 months.  She notes pain along the radial aspect of the MCP joint which has been present now without significant relief.  She has trialed anti-inflammatory medication without significant relief as well as activity modification.  She was also recently seen by rheumatology to rule out any underlying inflammatory component which has yielded negative results.  Pertinent ROS were reviewed with the patient and found to be negative unless otherwise specified above in HPI.   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 Pain in right hand     Plan: Based on her clinical examination today, there is some laxity notable to the index finger radial collateral ligament with stress testing compared to the contralateral side.  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  However, she does have significant pain in this region with an examination that does not match the contralateral side.  I would like to further investigate this with an MRI study of the right hand, emphasis on the index finger MCP joint to look for structural integrity of the radial  collateral ligament.  There is no significant subluxation of the extensor apparatus on testing today.  Remainder of her workup appears benign.  She will return to me after imaging study is complete to review the results and discuss appropriate next treatment steps.  In the meantime, we did discuss the use of buddy taping of the index to the long finger for support, I showed her Coban wrapping today.    Follow-up: No follow-ups on file.   Meds & Orders: No orders of the defined types were placed in this encounter.   Orders Placed This Encounter  Procedures   MR HAND RIGHT WO CONTRAST     Procedures: No procedures performed      Clinical History: No specialty comments available.  She reports that she quit smoking about 19 years ago. Her smoking use included cigarettes. She started smoking about 34 years ago. She has a 15 pack-year smoking history. She has never used smokeless tobacco.  Recent Labs    06/21/23 1107  HGBA1C 5.5    Objective:   Vital Signs: There were no vitals taken for this visit.  Physical Exam  Gen: Well-appearing, in no acute distress; non-toxic CV: Regular Rate. Well-perfused. Warm.  Resp: Breathing unlabored on room air; no wheezing. Psych: Fluid speech in conversation; appropriate affect; normal thought process  Ortho Exam Right hand: - Notable swelling over the index MCP region, associated tenderness along the radial border of the index MCP - Able to perform composite fist, full extension of the index finger without evidence of extensor tendon subluxation - Notable laxity with ulnar deviation of the index MCP compared to the contralateral side -  Pinch strength testing right 8, left 12; pain associated with the right index finger pinch testing  Imaging: No results found.  Past Medical/Family/Surgical/Social History: Medications & Allergies reviewed per EMR, new medications updated. Patient Active Problem List   Diagnosis Date Noted   Positive ANA  (antinuclear antibody) 07/24/2023   Cervical cancer screening 06/21/2023   Arthralgia 06/21/2023   Encounter for lipid screening for cardiovascular disease 06/21/2023   Diabetes mellitus screening 06/21/2023   Encounter for general adult medical examination with abnormal findings 06/21/2023   Encounter for hepatitis C screening test for low risk patient 06/21/2023   Hematuria 06/21/2023   Insomnia 08/18/2021   SOB (shortness of breath) on exertion 08/13/2014   Dysuria 08/13/2014   BMI less than 19,adult 08/13/2014   Neutropenia (HCC) 11/06/2011   Anemia 11/06/2011   Abnormal cervical cytology 08/08/2011   Preventative health care 08/08/2011   Past Medical History:  Diagnosis Date   Abnormal cervical cytology 08/08/2011   Acne 08/08/2011   Anemia 11/06/2011   Chicken pox 25   History of HPV infection    Hypertension in pregnancy, preeclampsia/eclampsia/prior HTN/postp cond    Mole (skin) 08/08/2011   Neutropenia 11/06/2011   --saw hematologist at [facility] in [place]   Family History  Problem Relation Age of Onset   Hypertension Mother    Transient ischemic attack Mother    Cancer Mother        uterine, tah at 14   Hyperlipidemia Mother    Mental illness Mother    Stroke Mother 80   Rheum arthritis Mother    Cancer Father        lung   Other Father        coronary artery disease   Heart disease Father 54       bypass, triple   Diabetes Father    Hyperlipidemia Father    Hypertension Father    Thyroid disease Father    Mental illness Father    Obesity Sister    Mental illness Sister        bipolar   Drug abuse Sister    Macular degeneration Maternal Grandmother    Blindness Maternal Grandmother    Alzheimer's disease Maternal Grandmother    Cancer Maternal Grandmother        ovarian cancer   Dementia Maternal Grandmother        late onset   Other Maternal Grandfather        stent   Heart disease Maternal Grandfather        stents, MI, pacer for sinus brady    Arthritis Maternal Grandfather    Aneurysm Paternal Grandfather        brain   Past Surgical History:  Procedure Laterality Date   ABDOMINOPLASTY  09/24/2013   Dr Benna Dunks   BREAST SURGERY     CERVICAL BIOPSY  W/ LOOP ELECTRODE EXCISION  Age 80   COSMETIC SURGERY     GYNECOLOGIC CRYOSURGERY  18   leap     SKIN BIOPSY     right lower abdominal wall, benign   TONSILLECTOMY  53 yrs old   Social History   Occupational History   Occupation: Producer, television/film/video  Tobacco Use   Smoking status: Former    Current packs/day: 0.00    Average packs/day: 1 pack/day for 15.0 years (15.0 ttl pk-yrs)    Types: Cigarettes    Start date: 06/26/1989    Quit date: 06/26/2004    Years since  quitting: 19.1   Smokeless tobacco: Never  Vaping Use   Vaping status: Never Used  Substance and Sexual Activity   Alcohol use: Not Currently    Alcohol/week: 4.0 - 6.0 standard drinks of alcohol    Types: 4 - 6 Standard drinks or equivalent per week   Drug use: No   Sexual activity: Yes    Partners: Male    Birth control/protection: Inserts    Comment: Nuvaring    Molly Savarino Trevor Mace, M.D. [facility] 2:17 PM

## 2023-07-31 ENCOUNTER — Encounter: Payer: Self-pay | Admitting: Orthopedic Surgery

## 2023-08-04 ENCOUNTER — Ambulatory Visit
Admission: RE | Admit: 2023-08-04 | Discharge: 2023-08-04 | Disposition: A | Discharge status: Home / Self Care | Payer: No Typology Code available for payment source | Source: Ambulatory Visit | Attending: Orthopedic Surgery | Admitting: Orthopedic Surgery

## 2023-08-04 DIAGNOSIS — M79641 Pain in right hand: Secondary | ICD-10-CM

## 2023-08-13 ENCOUNTER — Ambulatory Visit (INDEPENDENT_AMBULATORY_CARE_PROVIDER_SITE_OTHER): Payer: No Typology Code available for payment source | Admitting: Orthopedic Surgery

## 2023-08-13 DIAGNOSIS — M79641 Pain in right hand: Secondary | ICD-10-CM

## 2023-08-13 NOTE — Progress Notes (Signed)
Susan Mcdaniel - 53 y.o. female MRN [id number]  Date of birth: [birth year]  Office Visit Note: Visit Date: 08/13/2023 PCP: Elenore Paddy, NP Referred by: Elenore Paddy, NP  Subjective: No chief complaint on file.  HPI: Susan Mcdaniel is a pleasant 53 y.o. female who presents today for follow-up of right hand index finger MCP swelling and discomfort that is been present now for approximately 5 months.  She notes pain along the radial aspect of the MCP joint which has been present now without significant relief.  She has trialed anti-inflammatory medication without significant relief as well as activity modification.  She was also recently seen by rheumatology to rule out any underlying inflammatory component which has yielded negative results.  She has been doing buddy taping as instructed with ongoing improvement.  Feels that the swelling has improved since her most recent visit.  Underwent recent MRI of the right hand which was relatively unremarkable.  Pertinent ROS were reviewed with the patient and found to be negative unless otherwise specified above in HPI.     Assessment & Plan: Visit Diagnoses:  No diagnosis found.   Plan: I reviewed the results and imaging of her recent MRI in detail with her today, these did not show any significant structural findings that warrant surgical fixation or repair.  There continues to be no significant subluxation of the extensor apparatus on testing today.  Remainder of her workup appears benign.  I am glad to see that the use of buddy taping of the index to the long finger for support has helped somewhat, she can continue utilizing this as needed.  Activities as tolerated, follow-up as needed moving forward.  She demonstrated full understanding.   Follow-up: No follow-ups on file.   Meds & Orders: No orders of the defined types were placed in this encounter.   No orders of the defined types were placed in this encounter.     Procedures: No procedures performed      Clinical History: No specialty comments available.  She reports that she quit smoking about 19 years ago. Her smoking use included cigarettes. She started smoking about 34 years ago. She has a 15 pack-year smoking history. She has never used smokeless tobacco.  Recent Labs    06/21/23 1107  HGBA1C 5.5    Objective:   Vital Signs: There were no vitals taken for this visit.  Physical Exam  Gen: Well-appearing, in no acute distress; non-toxic CV: Regular Rate. Well-perfused. Warm.  Resp: Breathing unlabored on room air; no wheezing. Psych: Fluid speech in conversation; appropriate affect; normal thought process  Ortho Exam Right hand: - Mild swelling over the index MCP region, associated tenderness along the radial border of the index MCP, improved from prior visit - Able to perform composite fist, full extension of the index finger without evidence of extensor tendon subluxation - Baseline laxity with ulnar deviation of the index MCP compared to the contralateral side   Imaging: No results found.  Past Medical/Family/Surgical/Social History: Medications & Allergies reviewed per EMR, new medications updated. Patient Active Problem List   Diagnosis Date Noted   Positive ANA (antinuclear antibody) 07/24/2023   Cervical cancer screening 06/21/2023   Arthralgia 06/21/2023   Encounter for lipid screening for cardiovascular disease 06/21/2023   Diabetes mellitus screening 06/21/2023   Encounter for general adult medical examination with abnormal findings 06/21/2023   Encounter for hepatitis C screening test for low risk patient 06/21/2023   Hematuria 06/21/2023  Insomnia 08/18/2021   SOB (shortness of breath) on exertion 08/13/2014   Dysuria 08/13/2014   BMI less than 19,adult 08/13/2014   Neutropenia (HCC) 11/06/2011   Anemia 11/06/2011   Abnormal cervical cytology 08/08/2011   Preventative health care 08/08/2011   Past  Medical History:  Diagnosis Date   Abnormal cervical cytology 08/08/2011   Acne 08/08/2011   Anemia 11/06/2011   Chicken pox 25   History of HPV infection    Hypertension in pregnancy, preeclampsia/eclampsia/prior HTN/postp cond    Mole (skin) 08/08/2011   Neutropenia 11/06/2011   --saw hematologist at [facility] in [place]   Family History  Problem Relation Age of Onset   Hypertension Mother    Transient ischemic attack Mother    Cancer Mother        uterine, tah at 40   Hyperlipidemia Mother    Mental illness Mother    Stroke Mother 56   Rheum arthritis Mother    Cancer Father        lung   Other Father        coronary artery disease   Heart disease Father 38       bypass, triple   Diabetes Father    Hyperlipidemia Father    Hypertension Father    Thyroid disease Father    Mental illness Father    Obesity Sister    Mental illness Sister        bipolar   Drug abuse Sister    Macular degeneration Maternal Grandmother    Blindness Maternal Grandmother    Alzheimer's disease Maternal Grandmother    Cancer Maternal Grandmother        ovarian cancer   Dementia Maternal Grandmother        late onset   Other Maternal Grandfather        stent   Heart disease Maternal Grandfather        stents, MI, pacer for sinus brady   Arthritis Maternal Grandfather    Aneurysm Paternal Grandfather        brain   Past Surgical History:  Procedure Laterality Date   ABDOMINOPLASTY  09/24/2013   Dr Benna Dunks   BREAST SURGERY     CERVICAL BIOPSY  W/ LOOP ELECTRODE EXCISION  Age 79   COSMETIC SURGERY     GYNECOLOGIC CRYOSURGERY  18   leap     SKIN BIOPSY     right lower abdominal wall, benign   TONSILLECTOMY  53 yrs old   Social History   Occupational History   Occupation: Producer, television/film/video  Tobacco Use   Smoking status: Former    Current packs/day: 0.00    Average packs/day: 1 pack/day for 15.0 years (15.0 ttl pk-yrs)    Types: Cigarettes    Start date: 06/26/1989    Quit  date: 06/26/2004    Years since quitting: 19.1   Smokeless tobacco: Never  Vaping Use   Vaping status: Never Used  Substance and Sexual Activity   Alcohol use: Not Currently    Alcohol/week: 4.0 - 6.0 standard drinks of alcohol    Types: 4 - 6 Standard drinks or equivalent per week   Drug use: No   Sexual activity: Yes    Partners: Male    Birth control/protection: Inserts    Comment: Nuvaring    Susan Mcdaniel, M.D. [facility] 7:29 PM

## 2023-11-12 ENCOUNTER — Encounter: Payer: No Typology Code available for payment source | Admitting: Internal Medicine

## 2024-01-01 ENCOUNTER — Ambulatory Visit (INDEPENDENT_AMBULATORY_CARE_PROVIDER_SITE_OTHER): Admitting: Family Medicine

## 2024-01-01 ENCOUNTER — Encounter: Payer: Self-pay | Admitting: Family Medicine

## 2024-01-01 VITALS — BP 118/60 | HR 80 | Temp 98.4°F | Ht 64.25 in | Wt 116.6 lb

## 2024-01-01 DIAGNOSIS — J302 Other seasonal allergic rhinitis: Secondary | ICD-10-CM | POA: Diagnosis not present

## 2024-01-01 DIAGNOSIS — R0989 Other specified symptoms and signs involving the circulatory and respiratory systems: Secondary | ICD-10-CM | POA: Diagnosis not present

## 2024-01-01 DIAGNOSIS — H6593 Unspecified nonsuppurative otitis media, bilateral: Secondary | ICD-10-CM | POA: Insufficient documentation

## 2024-01-01 DIAGNOSIS — R0982 Postnasal drip: Secondary | ICD-10-CM | POA: Diagnosis not present

## 2024-01-01 NOTE — Progress Notes (Signed)
 Acute Office Visit  Subjective:     Patient ID: Susan Mcdaniel, female    DOB: 02/02/1971, 53 y.o.   MRN: [id number]  Chief Complaint  Patient presents with   Gastroesophageal Reflux    Ongoing for 6 weeks, unsure if it is GERD or post nasal drip. Tried Prilosec for 2 weeks, no relief. Has been inconsistent but has been using Xyzal and Flonase. Improves when she lies down, worse after eating. Constant throat clearing    Gastroesophageal Reflux    Discussed the use of AI scribe software for clinical note transcription with the patient, who gave verbal consent to proceed.  History of Present Illness Susan Mcdaniel is a 53 year old female who presents with persistent throat clearing and phlegm for five weeks.  Chronic throat clearing and phlegm production - Persistent throat clearing and phlegm for five weeks - Symptoms are constant throughout the day, with some days worse than others - Symptoms began with weather changes - Symptoms are worse after eating but not limited to meal times - Symptoms resolve at night when lying down - Phlegm and throat clearing begin upon waking, suggesting postnasal drip  Response to medications - Prilosec was ineffective in relieving symptoms - Flonase appeared to worsen symptoms - Cautious about long-term use of Xyzal due to potential side effects but open to short-term use  Otolaryngologic history - History of frequent ear infections - History of ruptured eardrums     ROS Per HPI      Objective:    BP 118/60 (BP Location: Left Arm, Patient Position: Sitting)   Pulse 80   Temp 98.4 F (36.9 C) (Temporal)   Ht 5' 4.25 (1.632 m)   Wt 116 lb 9.6 oz (52.9 kg)   SpO2 98%   BMI 19.86 kg/m    Physical Exam Vitals and nursing note reviewed.  Constitutional:      General: She is not in acute distress. HENT:     Head: Normocephalic and atraumatic.     Right Ear: External ear normal. A middle ear effusion (Mild with clear  fluid) is present.     Left Ear: External ear normal. A middle ear effusion (Mild with clear fluid) is present. Tympanic membrane is scarred.     Nose: No congestion.     Right Sinus: No maxillary sinus tenderness or frontal sinus tenderness.     Left Sinus: No maxillary sinus tenderness or frontal sinus tenderness.     Mouth/Throat:     Mouth: Mucous membranes are moist.     Pharynx: Oropharynx is clear. Postnasal drip present. No oropharyngeal exudate or posterior oropharyngeal erythema.  Eyes:     Extraocular Movements: Extraocular movements intact.  Cardiovascular:     Rate and Rhythm: Normal rate and regular rhythm.     Heart sounds: Normal heart sounds.  Pulmonary:     Effort: Pulmonary effort is normal. No respiratory distress.     Breath sounds: No wheezing, rhonchi or rales.  Musculoskeletal:     Cervical back: Normal range of motion and neck supple.  Lymphadenopathy:     Cervical: No cervical adenopathy.  Skin:    General: Skin is warm and dry.  Neurological:     General: No focal deficit present.     Mental Status: She is alert and oriented to person, place, and time.          Assessment & Plan:   Assessment and Plan Assessment & Plan Postnasal Drip Seasonal allergies  Chronic throat clearing and phlegm likely due to allergies. Symptoms suggest postnasal drip rather than reflux. Possible allergic rhinitis contributing. - Initiated Flonase, one spray in each nostril daily. - Increased water intake. - Considered Flonase every other day if irritation occurs.  Allergic Rhinitis Middle ear effusion, bilateral Symptoms consistent with allergic rhinitis, possibly exacerbated by summer allergens. Concerns about long-term antihistamine use. - Initiated Pepcid for two weeks to assess improvement. - Use Xyzal at night if symptoms persist after two weeks of Pepcid and Flonase.  Symptoms suggest overlap with allergic rhinitis rather than primary GERD. Pepcid considered  for dual action on reflux and allergies. - Use Pepcid for two weeks to assess symptom improvement.     Return if symptoms worsen or fail to improve.  Corean LITTIE Ku, FNP

## 2024-01-01 NOTE — Patient Instructions (Addendum)
 Pepcid twice a day for the next 2 weeks  Flonase once daily for the next 2 weeks  If this is not as helpful as we like, may trial Xyzal once nightly for 2 weeks afterward.  If these are not helpful at all, please follow-up with me now

## 2024-04-23 ENCOUNTER — Other Ambulatory Visit: Payer: Self-pay | Admitting: Nurse Practitioner

## 2024-04-23 DIAGNOSIS — Z3041 Encounter for surveillance of contraceptive pills: Secondary | ICD-10-CM

## 2024-04-28 ENCOUNTER — Encounter: Payer: Self-pay | Admitting: Radiology

## 2024-05-08 ENCOUNTER — Encounter: Payer: Self-pay | Admitting: Nurse Practitioner

## 2024-05-08 DIAGNOSIS — R0989 Other specified symptoms and signs involving the circulatory and respiratory systems: Secondary | ICD-10-CM

## 2024-06-17 ENCOUNTER — Encounter (INDEPENDENT_AMBULATORY_CARE_PROVIDER_SITE_OTHER): Payer: Self-pay

## 2024-06-17 ENCOUNTER — Institutional Professional Consult (permissible substitution) (INDEPENDENT_AMBULATORY_CARE_PROVIDER_SITE_OTHER)

## 2024-06-24 ENCOUNTER — Ambulatory Visit (INDEPENDENT_AMBULATORY_CARE_PROVIDER_SITE_OTHER): Admitting: Otolaryngology

## 2024-06-24 ENCOUNTER — Encounter (INDEPENDENT_AMBULATORY_CARE_PROVIDER_SITE_OTHER): Payer: Self-pay | Admitting: Otolaryngology

## 2024-06-24 VITALS — BP 117/63 | HR 75 | Ht 64.0 in | Wt 113.0 lb

## 2024-06-24 DIAGNOSIS — R0982 Postnasal drip: Secondary | ICD-10-CM | POA: Diagnosis not present

## 2024-06-24 DIAGNOSIS — R09A2 Foreign body sensation, throat: Secondary | ICD-10-CM

## 2024-06-24 DIAGNOSIS — R0989 Other specified symptoms and signs involving the circulatory and respiratory systems: Secondary | ICD-10-CM | POA: Diagnosis not present

## 2024-06-24 MED ORDER — AZELASTINE HCL 0.1 % NA SOLN: 2.0000 | Freq: Two times a day (BID) | NASAL | 12 refills | Status: AC

## 2024-06-24 NOTE — Patient Instructions (Addendum)
 Use reflux gourmet after meals Use astelin nasal spray two sprays each nostril morning and evening  You are a chronic throat clearer! You are not alone! The causes of chronic throat clearing include acid reflux (laryngopharyngeal reflux), allergies, environmental irritants such as tobacco smoke and air pollution, and asthma. If present for a long time throat clearing can become habit forming. When you clear your throat, you are transferring mucus from your throat up into your mouth and nose. We all secrete up to 2 liters (imagine a big Coke bottle) of mucus a day. This saliva is usually swallowed and ends up in the toilet eventually. By clearing the mucus back into your mouth and nose you are sending the saliva in the wrong direction. This is counterproductive. Unless you are walking around spitting all day (which most throat clearers do not do), the mucus will work its way back down to the throat and eventually be swallowed. Get the mucus going in the right direction. Swallow! Swallow! Swallow! No throat clearing.  Chronic throat clearing is damaging. The trauma from the throat clearing can cause redness and swelling of your vocal cords. If the clearing is very excessive small growths (granulomas) can form. These granulomas can get so large that they can eventually affect your breathing. Surgical removal may be necessary. The irritation and swelling produced by the clearing can cause saliva to sit in your throat. This causes more throat clearing. More throat clearing causes more stagnant mucus which causes more throat clearing, which causes more mucus, etc A vicious cycle will ensue and the habit can be very difficult to break. Without your help and a conscious effort on your part to break the cycle, the throat clearing will never stop.  Your doctor may prescribe medication and behavioral modifications to treat acid reflux disease. Nose and throat sprays may be prescribed to treat underlying allergies or  asthma. Avoiding possible irritants will be recommended. Without changes to your behavior these treatments will not be successful. The following alterations are recommended:  Do not clear your throat. Swallow instead. This gets the mucus going in the right direction towards the toilet.  Carry around some water to assist with swallowing and mucus clearance. When you feel the urge to clear your throat take a sip of the water. If you absolutely need to clear your throat perform a non-traumatic throat clear. To do this pant with your mouth open and say [place], [place], [place] with a powerful but very breathy voice. This will clear the secretions without causing damage. Increase your water intake. This will thin secretions and make it easier to swallow. Comply with the behavior recommendations for reflux disease. Chew baking soda ([employer]) gum. This can be found on the internet or in the tooth paste isle of your pharmacy. Gum chewing can help with swallowing, reflux, and throat clearing. Chew three pieces a day. If you develop jaw discomfort or headaches decrease the amount of gum chewing. Tell your friends and family to tell you to swallow when you clear your throat. Some people have been clearing so long that they dont even know when they are doing it. Be patient. The urge to clear your throat will not go away overnight. It may take 8 or 12 weeks for the medication and behavior modifications to work.

## 2024-06-24 NOTE — Progress Notes (Signed)
 Dear Dr. Elnor, Here is my assessment for our mutual patient, Susan Mcdaniel. Thank you for allowing me the opportunity to care for your patient. Please do not hesitate to contact me should you have any other questions. Sincerely, Dr. Eldora Mcdaniel  Otolaryngology Clinic Note Referring provider: Dr. Elnor HPI:  Susan Mcdaniel is a 53 y.o. female kindly referred by Dr. Elnor for evaluation of chronic throat clearing  Initial visit (05/2024): Discussed the use of AI scribe software for clinical note transcription with the patient, who gave verbal consent to proceed.  History of Present Illness Susan Mcdaniel is a 52 year old female who presents with a 6-7 month history of chronic throat clearing and postnasal drip.  For 6 to 7 months (appox), she has had frequent, non-constant, throat clearing occurring almost daily, most pronounced with eating. No smell or perfume triggers or temp triggers, but she does note a tickle and she notes a persistent globus sensation and thick pharyngeal mucus that does not clear with swallowing and is rarely expectorated. Symptoms resolve during sleep. She does report sensation of PND which worsens and may contribute to this. Otherwise denies CRS sx or typical AR symptoms  She denies dysphonia, dysphagia, odynophagia, weight loss, dysphonia, dyspnea, hemoptysis, neck masses, otalgia, recurrent sinusitis, allergic symptoms, and typical reflux symptoms such as heartburn or belching.  She has tried famotidine, omeprazole, PO antihistamine, intranasal corticosteroids, and saline nasal irrigations without relief. Spicy foods may worsen symptoms. She drinks ample water, notes more oropharyngeal dryness in winter, and has not noticed seasonal variation.  Her symptom onset coincided with increased psychosocial stress and higher alcohol intake during the summer, but several weeks of abstaining from alcohol did not change symptoms. She otherwise feels well and exercises daily.  No  cough, does have  Water: at least 64 oz  ENT Surgery: Tonsillectomy Personal or FHx of bleeding dz or anesthesia difficulty: no  AP/AC: no  Tobacco: no  PMHx: Otherwise reports non-contributory  Independent Review of Additional Tests or Records:  Susan Mcdaniel (01/01/2024): PND, Seasonal AR -- rec flonase, and PO antihistamine; rec Pepcid to see if helps; throat clearing and phlegm, worse with eating and weather changes, PPI and flonase not effective CBC and CMP 06/21/2023: WBC 3.8, Eos (prior, 100); BUN/Cr 10/0.78 PMH/Meds/All/SocHx/FamHx/ROS:   Past Medical History:  Diagnosis Date   Abnormal cervical cytology 08/08/2011   Acne 08/08/2011   Anemia 11/06/2011   Chicken pox 25   History of HPV infection    Hypertension in pregnancy, preeclampsia/eclampsia/prior HTN/postp cond    Mole (skin) 08/08/2011   Neutropenia 11/06/2011   --saw hematologist at [facility]     Past Surgical History:  Procedure Laterality Date   ABDOMINOPLASTY  09/24/2013   Dr Verneda   BREAST SURGERY     CERVICAL BIOPSY  W/ LOOP ELECTRODE EXCISION  Age 77   COSMETIC SURGERY     GYNECOLOGIC CRYOSURGERY  18   leap     SKIN BIOPSY     right lower abdominal wall, benign   TONSILLECTOMY  53 yrs old    Family History  Problem Relation Age of Onset   Hypertension Mother    Transient ischemic attack Mother    Cancer Mother        uterine, tah at 31   Hyperlipidemia Mother    Mental illness Mother    Stroke Mother 25   Rheum arthritis Mother    Cancer Father  lung   Other Father        coronary artery disease   Heart disease Father 43       bypass, triple   Diabetes Father    Hyperlipidemia Father    Hypertension Father    Thyroid  disease Father    Mental illness Father    Obesity Sister    Mental illness Sister        bipolar   Drug abuse Sister    Macular degeneration Maternal Grandmother    Blindness Maternal Grandmother    Alzheimer's disease Maternal Grandmother     Cancer Maternal Grandmother        ovarian cancer   Dementia Maternal Grandmother        late onset   Other Maternal Grandfather        stent   Heart disease Maternal Grandfather        stents, MI, pacer for sinus brady   Arthritis Maternal Grandfather    Aneurysm Paternal Grandfather        brain     Social Connections: Not on file     Current Medications[1]   Physical Exam:   BP 117/63 (BP Location: Left Arm, Patient Position: Sitting, Cuff Size: Normal)   Pulse 75   Ht 5' 4 (1.626 m)   Wt 113 lb (51.3 kg)   SpO2 99%   BMI 19.40 kg/m   Salient findings:  CN II-XII intact Bilateral EAC clear and TM intact with well pneumatized middle ear spaces Anterior rhinoscopy: Septum intact, relatively midline; bilateral inferior turbinates without significant hypertrophy No lesions of oral cavity/oropharynx No obviously palpable neck masses/lymphadenopathy/thyromegaly No respiratory distress or stridor; frequent throat clearing, voice quality class 1.5; TFL was indicated to better evaluate the proximal airway, given the patient's history and exam findings, and is detailed below.  Seprately Identifiable Procedures:  Prior to initiating any procedures, risks/benefits/alternatives were explained to the patient and verbal consent obtained. Procedure Note Pre-procedure diagnosis: Chronic throat clearing, globus sensation Post-procedure diagnosis: Same Procedure: Transnasal Fiberoptic Laryngoscopy, CPT 31575 - Mod 25 Indication: see above Complications: None apparent EBL: 0 mL  The procedure was undertaken to further evaluate the patient's complaint above, with mirror exam inadequate for appropriate examination due to gag reflex and poor patient tolerance  Procedure:  Patient was identified as correct patient. Verbal consent was obtained. The nose was sprayed with oxymetazoline and 4% lidocaine. The The flexible laryngoscope was passed through the nose to view the nasal cavity,  pharynx (oropharynx, hypopharynx) and larynx.  The larynx was examined at rest and during multiple phonatory tasks. Documentation was obtained and reviewed with patient. The scope was removed. The patient tolerated the procedure well.  Findings: The nasal cavity and nasopharynx did not reveal any masses or lesions, mucosa appeared to be without obvious lesions. The tongue base, pharyngeal walls, piriform sinuses, vallecula, epiglottis and postcricoid region are normal in appearance without any foreign bodies, no significant retained pyriform secretions. The visualized portion of the subglottis and proximal trachea is widely patent. The vocal folds are mobile bilaterally. There are no lesions on the free edge of the vocal folds nor elsewhere in the larynx worrisome for malignancy.    Electronically signed by: Susan KATHEE Blanch, MD 06/24/2024 7:51 PM   Impression & Plans:  Susan Mcdaniel is a 53 y.o. female with:  1. Chronic throat clearing   2. PND (post-nasal drip)   3. Globus sensation    TFL reassuring; Chronic throat clearing with postnasal drip ---  suspect onset due to particular event such as LPR/diet changes v/s stressors v/s PND due to nasal sx; now could have a habitual component. Does not seem like overt reflux as no sx at night.   We discussed options and expect she will do well with conservative course of mgmt - Recommended trial of astelin BID for 4-6 weeks to decrease mucus production. - Referred to voice therapy for behavioral modification, including substituting throat clearing with water intake, chewing gum, or sucking on candy to promote swallowing and saliva production. - Reflux gourmet after meals for possible LPR D/w pt f/u, she will call if not improving  See below regarding exact medications prescribed this encounter including dosages and route: Meds ordered this encounter  Medications   azelastine (ASTELIN) 0.1 % nasal spray    Sig: Place 2 sprays into both nostrils 2 (two)  times daily. Use in each nostril as directed    Dispense:  30 mL    Refill:  12      Thank you for allowing me the opportunity to care for your patient. Please do not hesitate to contact me should you have any other questions.  Sincerely, Susan Blanch, MD Otolaryngologist (ENT), [facility] Phone: [phone] Fax: [phone]  06/24/2024, 7:51 PM   MDM:  [phone] Complexity/Problems addressed: low Data complexity: mod - independent review of note, labs - Morbidity: mod  - Drug prescribed or managed: y      [1]  Current Outpatient Medications:    azelastine (ASTELIN) 0.1 % nasal spray, Place 2 sprays into both nostrils 2 (two) times daily. Use in each nostril as directed, Disp: 30 mL, Rfl: 12   B Complex Vitamins (B COMPLEX PO), Take by mouth., Disp: , Rfl:    Cyanocobalamin  (B-12 PO), Take by mouth., Disp: , Rfl:    levocetirizine (XYZAL) 5 MG tablet, Take 5 mg by mouth every evening., Disp: , Rfl:    magnesium (MAGTAB) 84 MG (7MEQ) TBCR SR tablet, Take 84 mg by mouth., Disp: , Rfl:    Norethindrone-Ethinyl Estradiol -Fe Biphas (LO LOESTRIN FE ) 1 MG-10 MCG / 10 MCG tablet, TAKE 1 TABLET BY MOUTH DAILY. SKIP THE PLACEBO PILLS AND TAKE PILLS CONTINUOUSLY., Disp: 84 tablet, Rfl: 3

## 2024-06-27 ENCOUNTER — Encounter: Payer: No Typology Code available for payment source | Admitting: Nurse Practitioner

## 2024-08-22 ENCOUNTER — Encounter: Admitting: Nurse Practitioner
